# Patient Record
Sex: Female | Born: 1985 | Race: Black or African American | Hispanic: No | Marital: Single | Smoking: Never smoker
Health system: Southern US, Community
[De-identification: ages and names within clinical notes are randomized; demographics above are authoritative.]

## PROBLEM LIST (undated history)

## (undated) ENCOUNTER — Inpatient Hospital Stay (HOSPITAL_COMMUNITY): Payer: Self-pay

## (undated) DIAGNOSIS — O039 Complete or unspecified spontaneous abortion without complication: Secondary | ICD-10-CM

## (undated) DIAGNOSIS — Z789 Other specified health status: Secondary | ICD-10-CM

## (undated) HISTORY — PX: NO PAST SURGERIES: SHX2092

---
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
## 2008-01-16 ENCOUNTER — Emergency Department (HOSPITAL_COMMUNITY): Admission: EM | Admit: 2008-01-16 | Discharge: 2008-01-16 | Payer: Self-pay | Admitting: Emergency Medicine

## 2008-01-19 ENCOUNTER — Emergency Department (HOSPITAL_COMMUNITY): Admission: EM | Admit: 2008-01-19 | Discharge: 2008-01-19 | Payer: Self-pay | Admitting: Emergency Medicine

## 2008-01-20 ENCOUNTER — Emergency Department (HOSPITAL_COMMUNITY): Admission: EM | Admit: 2008-01-20 | Discharge: 2008-01-20 | Payer: Self-pay | Admitting: Emergency Medicine

## 2008-10-30 ENCOUNTER — Emergency Department (HOSPITAL_COMMUNITY): Admission: EM | Admit: 2008-10-30 | Discharge: 2008-10-30 | Payer: Self-pay | Admitting: Emergency Medicine

## 2011-09-07 LAB — URINALYSIS, ROUTINE W REFLEX MICROSCOPIC
Glucose, UA: NEGATIVE
Ketones, ur: NEGATIVE
Nitrite: NEGATIVE
pH: 6

## 2011-09-07 LAB — WET PREP, GENITAL
Trich, Wet Prep: NONE SEEN
Yeast Wet Prep HPF POC: NONE SEEN

## 2011-09-07 LAB — GC/CHLAMYDIA PROBE AMP, GENITAL: GC Probe Amp, Genital: NEGATIVE

## 2011-09-07 LAB — PREGNANCY, URINE: Preg Test, Ur: NEGATIVE

## 2015-09-11 ENCOUNTER — Encounter (HOSPITAL_COMMUNITY): Payer: Self-pay | Admitting: *Deleted

## 2015-09-11 ENCOUNTER — Emergency Department (HOSPITAL_COMMUNITY)
Admission: EM | Admit: 2015-09-11 | Discharge: 2015-09-11 | Disposition: A | Discharge status: Home / Self Care | Attending: Emergency Medicine | Admitting: Emergency Medicine

## 2015-09-11 DIAGNOSIS — Z3202 Encounter for pregnancy test, result negative: Secondary | ICD-10-CM | POA: Insufficient documentation

## 2015-09-11 DIAGNOSIS — R3915 Urgency of urination: Secondary | ICD-10-CM | POA: Diagnosis present

## 2015-09-11 DIAGNOSIS — B9689 Other specified bacterial agents as the cause of diseases classified elsewhere: Secondary | ICD-10-CM

## 2015-09-11 DIAGNOSIS — N760 Acute vaginitis: Secondary | ICD-10-CM | POA: Insufficient documentation

## 2015-09-11 LAB — POC URINE PREG, ED: PREG TEST UR: NEGATIVE

## 2015-09-11 LAB — URINALYSIS, ROUTINE W REFLEX MICROSCOPIC
Bilirubin Urine: NEGATIVE
GLUCOSE, UA: NEGATIVE mg/dL
Hgb urine dipstick: NEGATIVE
Ketones, ur: NEGATIVE mg/dL
LEUKOCYTES UA: NEGATIVE
Nitrite: NEGATIVE
PROTEIN: NEGATIVE mg/dL
SPECIFIC GRAVITY, URINE: 1.02 (ref 1.005–1.030)
UROBILINOGEN UA: 0.2 mg/dL (ref 0.0–1.0)
pH: 8 (ref 5.0–8.0)

## 2015-09-11 LAB — WET PREP, GENITAL
TRICH WET PREP: NONE SEEN
Yeast Wet Prep HPF POC: NONE SEEN

## 2015-09-11 MED ORDER — METRONIDAZOLE 500 MG PO TABS: 500.0000 mg | ORAL_TABLET | Freq: Two times a day (BID) | ORAL | Status: DC

## 2015-09-11 NOTE — Discharge Instructions (Signed)

## 2015-09-11 NOTE — ED Provider Notes (Signed)
CSN: [id number]     Arrival date & time 09/11/15  1134 History   First MD Initiated Contact with Patient 09/11/15 1150     Chief Complaint  Patient presents with  . Urinary Urgency     (Consider location/radiation/quality/duration/timing/severity/associated sxs/prior Treatment) HPI   Caroline Weber is a 29 y.o. female who presents to the Emergency Department complaining of vaginal discharge for 2 days, urinary urgency for one day.  She describes the discharge as thin, white and non-odorous.  She also describes an urgency to urinate, but states that she voids a normal amount.  She denies new sexual partners, but admits to unprotected sex.  She also denies fever, burning or pain with urination, abdominal pain, n/v, hematuria, back pain, and pelvic pain.   History reviewed. No pertinent past medical history. History reviewed. No pertinent past surgical history. History reviewed. No pertinent family history. Social History  Substance Use Topics  . Smoking status: Never Smoker   . Smokeless tobacco: None  . Alcohol Use: Yes     Comment: occ   OB History    No data available     Review of Systems  Constitutional: Negative for fever, chills, activity change and appetite change.  Respiratory: Negative for chest tightness and shortness of breath.   Gastrointestinal: Negative for nausea, vomiting and abdominal pain.  Genitourinary: Positive for urgency and vaginal discharge. Negative for dysuria, frequency, hematuria, flank pain, decreased urine volume, vaginal bleeding, difficulty urinating and pelvic pain.  Musculoskeletal: Negative for back pain.  Skin: Negative for rash.  Neurological: Negative for dizziness, weakness and numbness.  Hematological: Negative for adenopathy.  Psychiatric/Behavioral: Negative for confusion.  All other systems reviewed and are negative.     Allergies  Review of patient's allergies indicates no known allergies.  Home Medications   Prior to  Admission medications   Medication Sig Start Date End Date Taking? Authorizing Provider  metroNIDAZOLE (FLAGYL) 500 MG tablet Take 1 tablet (500 mg total) by mouth 2 (two) times daily. 09/11/15   Demika Langenderfer, PA-C   BP 116/75 mmHg  Pulse 80  Temp(Src) 98.2 F (36.8 C) (Oral)  Resp 16  Ht  (1.6 m)  Wt 170 lb (77.111 kg)  BMI 30.12 kg/m2  SpO2 100% Physical Exam  Constitutional: She is oriented to person, place, and time. She appears well-developed and well-nourished. No distress.  HENT:  Head: Normocephalic and atraumatic.  Cardiovascular: Normal rate, regular rhythm, normal heart sounds and intact distal pulses.   No murmur heard. Pulmonary/Chest: Effort normal and breath sounds normal. No respiratory distress. She has no wheezes. She has no rales.  Abdominal: Soft. Normal appearance. She exhibits no distension and no mass. There is no hepatosplenomegaly. There is no tenderness. There is no rigidity, no rebound, no guarding, no CVA tenderness and no tenderness at McBurney's point.  Genitourinary: Uterus normal. There is no tenderness or lesion on the right labia. There is no tenderness or lesion on the left labia. Cervix exhibits no motion tenderness, no discharge and no friability. Right adnexum displays no mass and no tenderness. Left adnexum displays no mass and no tenderness. No bleeding in the vagina. No foreign body around the vagina. Vaginal discharge found.  Musculoskeletal: Normal range of motion. She exhibits no edema.  Neurological: She is alert and oriented to person, place, and time. Coordination normal.  Skin: Skin is warm and dry. No rash noted.  Psychiatric: She has a normal mood and affect.  Nursing note and vitals reviewed.  ED Course  Procedures (including critical care time) Labs Review Labs Reviewed  WET PREP, GENITAL - Abnormal; Notable for the following:    Clue Cells Wet Prep HPF POC RARE (*)    WBC, Wet Prep HPF POC FEW (*)    All other components  within normal limits  URINE CULTURE  URINALYSIS, ROUTINE W REFLEX MICROSCOPIC (NOT AT [facility])  POC URINE PREG, ED  GC/CHLAMYDIA PROBE AMP ([place]) NOT AT [facility]    Imaging Review No results found. I have personally reviewed and evaluated these images and lab results as part of my medical decision-making.   EKG Interpretation None      MDM   Final diagnoses:  Bacterial vaginosis   Pt is well appearing, abd is soft, NT.  No pelvic tenderness on exam.  Vitals stable.  Pt advised of risk of STD's, offered treatment here, she prefers to be contacted to return if GC, chlamydia cultures are positive.  Will treat for BV since patient is symptomatic.  She appears stable for d/c and agrees to return for any worsening sx's.     Pauline Aus, PA-C 09/13/15 1610  Lavera Guise, MD 09/15/15 [phone]

## 2015-09-11 NOTE — ED Notes (Signed)
Patient reports urinary urgency, denies dysuria that started this morning. Also reports vaginal discharge x 2 days.

## 2015-09-12 LAB — GC/CHLAMYDIA PROBE AMP (~~LOC~~) NOT AT ARMC
Chlamydia: NEGATIVE
Neisseria Gonorrhea: NEGATIVE

## 2015-09-13 LAB — URINE CULTURE

## 2016-03-03 ENCOUNTER — Ambulatory Visit (INDEPENDENT_AMBULATORY_CARE_PROVIDER_SITE_OTHER): Admitting: Family Medicine

## 2016-03-03 VITALS — BP 112/70 | HR 76 | Temp 98.0°F | Resp 16 | Ht 64.0 in | Wt 188.4 lb

## 2016-03-03 DIAGNOSIS — Z124 Encounter for screening for malignant neoplasm of cervix: Secondary | ICD-10-CM | POA: Diagnosis not present

## 2016-03-03 DIAGNOSIS — N898 Other specified noninflammatory disorders of vagina: Secondary | ICD-10-CM

## 2016-03-03 DIAGNOSIS — Z113 Encounter for screening for infections with a predominantly sexual mode of transmission: Secondary | ICD-10-CM

## 2016-03-03 DIAGNOSIS — J029 Acute pharyngitis, unspecified: Secondary | ICD-10-CM | POA: Diagnosis not present

## 2016-03-03 LAB — POCT URINALYSIS DIP (MANUAL ENTRY)
BILIRUBIN UA: NEGATIVE
Bilirubin, UA: NEGATIVE
Glucose, UA: NEGATIVE
Leukocytes, UA: NEGATIVE
Nitrite, UA: NEGATIVE
PROTEIN UA: NEGATIVE
RBC UA: NEGATIVE
Spec Grav, UA: 1.015
UROBILINOGEN UA: 0.2
pH, UA: 5.5

## 2016-03-03 LAB — POCT CBC
Granulocyte percent: 68.5 %G (ref 37–80)
HCT, POC: 37.4 % — AB (ref 37.7–47.9)
HEMOGLOBIN: 13.8 g/dL (ref 12.2–16.2)
LYMPH, POC: 2.3 (ref 0.6–3.4)
MCH: 32 pg — AB (ref 27–31.2)
MCHC: 36.9 g/dL — AB (ref 31.8–35.4)
MCV: 86.6 fL (ref 80–97)
MID (cbc): 0.4 (ref 0–0.9)
MPV: 8.5 fL (ref 0–99.8)
PLATELET COUNT, POC: 261 10*3/uL (ref 142–424)
POC Granulocyte: 6 (ref 2–6.9)
POC LYMPH PERCENT: 27 %L (ref 10–50)
POC MID %: 4.5 % (ref 0–12)
RBC: 4.32 M/uL (ref 4.04–5.48)
RDW, POC: 12.9 %
WBC: 8.7 10*3/uL (ref 4.6–10.2)

## 2016-03-03 LAB — POCT URINE PREGNANCY: PREG TEST UR: NEGATIVE

## 2016-03-03 LAB — POCT WET + KOH PREP
TRICH BY WET PREP: ABSENT
YEAST BY KOH: ABSENT
YEAST BY WET PREP: ABSENT

## 2016-03-03 LAB — HEPATITIS C ANTIBODY: HCV AB: NEGATIVE

## 2016-03-03 LAB — HIV ANTIBODY (ROUTINE TESTING W REFLEX): HIV 1&2 Ab, 4th Generation: NONREACTIVE

## 2016-03-03 LAB — POCT RAPID STREP A (OFFICE): RAPID STREP A SCREEN: NEGATIVE

## 2016-03-03 NOTE — Patient Instructions (Addendum)
   IF you received an x-ray today, you will receive an invoice from [facility]. Please contact  Radiology at [phone] with questions or concerns regarding your invoice.   IF you received labwork today, you will receive an invoice from [facility]. Please contact [facility] at [phone] with questions or concerns regarding your invoice.   Our billing staff will not be able to assist you with questions regarding bills from these companies.  You will be contacted with the lab results as soon as they are available. The fastest way to get your results is to activate your My Chart account. Instructions are located on the last page of this paperwork. If you have not heard from us regarding the results in 2 weeks, please contact this office.    Pharyngitis Pharyngitis is redness, pain, and swelling (inflammation) of your pharynx.  CAUSES  Pharyngitis is usually caused by infection. Most of the time, these infections are from viruses (viral) and are part of a cold. However, sometimes pharyngitis is caused by bacteria (bacterial). Pharyngitis can also be caused by allergies. Viral pharyngitis may be spread from person to person by coughing, sneezing, and personal items or utensils (cups, forks, spoons, toothbrushes). Bacterial pharyngitis may be spread from person to person by more intimate contact, such as kissing.  SIGNS AND SYMPTOMS  Symptoms of pharyngitis include:   Sore throat.   Tiredness (fatigue).   Low-grade fever.   Headache.  Joint pain and muscle aches.  Skin rashes.  Swollen lymph nodes.  Plaque-like film on throat or tonsils (often seen with bacterial pharyngitis). DIAGNOSIS  Your health care provider will ask you questions about your illness and your symptoms. Your medical history, along with a physical exam, is often all that is needed to diagnose pharyngitis. Sometimes, a rapid strep test is done. Other lab tests may  also be done, depending on the suspected cause.  TREATMENT  Viral pharyngitis will usually get better in 3-4 days without the use of medicine. Bacterial pharyngitis is treated with medicines that kill germs (antibiotics).  HOME CARE INSTRUCTIONS   Drink enough water and fluids to keep your urine clear or pale yellow.   Only take over-the-counter or prescription medicines as directed by your health care provider:   If you are prescribed antibiotics, make sure you finish them even if you start to feel better.   Do not take aspirin.   Get lots of rest.   Gargle with 8 oz of salt water ( tsp of salt per 1 qt of water) as often as every 1-2 hours to soothe your throat.   Throat lozenges (if you are not at risk for choking) or sprays may be used to soothe your throat. SEEK MEDICAL CARE IF:   You have large, tender lumps in your neck.  You have a rash.  You cough up green, yellow-brown, or bloody spit. SEEK IMMEDIATE MEDICAL CARE IF:   Your neck becomes stiff.  You drool or are unable to swallow liquids.  You vomit or are unable to keep medicines or liquids down.  You have severe pain that does not go away with the use of recommended medicines.  You have trouble breathing (not caused by a stuffy nose). MAKE SURE YOU:   Understand these instructions.  Will watch your condition.  Will get help right away if you are not doing well or get worse.   This information is not intended to replace advice given to you by your health care   provider. Make sure you discuss any questions you have with your health care provider.   Document Released: 11/22/2005 Document Revised: 09/12/2013 Document Reviewed: 07/30/2013 Elsevier Interactive Patient Education 2016 [employer].  Sexually Transmitted Disease A sexually transmitted disease (STD) is a disease or infection that may be passed (transmitted) from person to person, usually during sexual activity. This may happen by way of  saliva, semen, blood, vaginal mucus, or urine. Common STDs include:  Gonorrhea.  Chlamydia.  Syphilis.  HIV and AIDS.  Genital herpes.  Hepatitis B and C.  Trichomonas.  Human papillomavirus (HPV).  Pubic lice.  Scabies.  Mites.  Bacterial vaginosis. WHAT ARE CAUSES OF STDs? An STD may be caused by bacteria, a virus, or parasites. STDs are often transmitted during sexual activity if one person is infected. However, they may also be transmitted through nonsexual means. STDs may be transmitted after:   Sexual intercourse with an infected person.  Sharing sex toys with an infected person.  Sharing needles with an infected person or using unclean piercing or tattoo needles.  Having intimate contact with the genitals, mouth, or rectal areas of an infected person.  Exposure to infected fluids during birth. WHAT ARE THE SIGNS AND SYMPTOMS OF STDs? Different STDs have different symptoms. Some people may not have any symptoms. If symptoms are present, they may include:  Painful or bloody urination.  Pain in the pelvis, abdomen, vagina, anus, throat, or eyes.  A skin rash, itching, or irritation.  Growths, ulcerations, blisters, or sores in the genital and anal areas.  Abnormal vaginal discharge with or without bad odor.  Penile discharge in men.  Fever.  Pain or bleeding during sexual intercourse.  Swollen glands in the groin area.  Yellow skin and eyes (jaundice). This is seen with hepatitis.  Swollen testicles.  Infertility.  Sores and blisters in the mouth. HOW ARE STDs DIAGNOSED? To make a diagnosis, your health care provider may:  Take a medical history.  Perform a physical exam.  Take a sample of any discharge to examine.  Swab the throat, cervix, opening to the penis, rectum, or vagina for testing.  Test a sample of your first morning urine.  Perform blood tests.  Perform a Pap test, if this applies.  Perform a colposcopy.  Perform a  laparoscopy. HOW ARE STDs TREATED? Treatment depends on the STD. Some STDs may be treated but not cured.  Chlamydia, gonorrhea, trichomonas, and syphilis can be cured with antibiotic medicine.  Genital herpes, hepatitis, and HIV can be treated, but not cured, with prescribed medicines. The medicines lessen symptoms.  Genital warts from HPV can be treated with medicine or by freezing, burning (electrocautery), or surgery. Warts may come back.  HPV cannot be cured with medicine or surgery. However, abnormal areas may be removed from the cervix, vagina, or vulva.  If your diagnosis is confirmed, your recent sexual partners need treatment. This is true even if they are symptom-free or have a negative culture or evaluation. They should not have sex until their health care providers say it is okay.  Your health care provider may test you for infection again 3 months after treatment. HOW CAN I REDUCE MY RISK OF GETTING AN STD? Take these steps to reduce your risk of getting an STD:  Use latex condoms, dental dams, and water-soluble lubricants during sexual activity. Do not use petroleum jelly or oils.  Avoid having multiple sex partners.  Do not have sex with someone who has other sex partners  Do not have sex with anyone you do not know or who is at high risk for an STD.  Avoid risky sex practices that can break your skin.  Do not have sex if you have open sores on your mouth or skin.  Avoid drinking too much alcohol or taking illegal drugs. Alcohol and drugs can affect your judgment and put you in a vulnerable position.  Avoid engaging in oral and anal sex acts.  Get vaccinated for HPV and hepatitis. If you have not received these vaccines in the past, talk to your health care provider about whether one or both might be right for you.  If you are at risk of being infected with HIV, it is recommended that you take a prescription medicine daily to prevent HIV infection. This is called  pre-exposure prophylaxis (PrEP). You are considered at risk if:  You are a man who has sex with other men (MSM).  You are a heterosexual man or woman and are sexually active with more than one partner.  You take drugs by injection.  You are sexually active with a partner who has HIV.  Talk with your health care provider about whether you are at high risk of being infected with HIV. If you choose to begin PrEP, you should first be tested for HIV. You should then be tested every 3 months for as long as you are taking PrEP. WHAT SHOULD I DO IF I THINK I HAVE AN STD?  See your health care provider.  Tell your sexual partner(s). They should be tested and treated for any STDs.  Do not have sex until your health care provider says it is okay. WHEN SHOULD I GET IMMEDIATE MEDICAL CARE? Contact your health care provider right away if:   You have severe abdominal pain.  You are a man and notice swelling or pain in your testicles.  You are a woman and notice swelling or pain in your vagina.   This information is not intended to replace advice given to you by your health care provider. Make sure you discuss any questions you have with your health care provider.   Document Released: 02/12/2003 Document Revised: 12/13/2014 Document Reviewed: 06/12/2013 Elsevier Interactive Patient Education [employer]2016 Elsevier Inc.

## 2016-03-03 NOTE — Progress Notes (Signed)
Subjective:  This chart was scribed for Caroline Weber, by Caroline Weber,scribe, at [facility].  This patient was seen in room 3 and the patient's care was started at 8:37 AM.   Chief Complaint  Patient presents with  . Sore Throat  . Srd check     Patient ID: Caroline Weber, female    DOB: [birth year], 30 y.o.   MRN: [id number]  HPI HPI Comments: Caroline Weber is a [age] y.o. female who presents to the [facility] complaining of a sore throat onset 3-4 days ago with associated symptoms of coughing.  She states that she has a "red line" on her tonsils which she has had in the past when she had strep throat.   Patient has used Penicillin (twice yesterday and 1 today) from her past sinus infection in order to treat herself but denies any significant change to her symptoms.   She denies any fevers/chills, body aches, sinus congestion, rhinorrhea.  She did not have any exposures to strep throat which she is aware of at work but states that her co workers have been coughing frequently.  Patient has not had a flu shot this year.   Patient would also like a routine STD test today.  She does not have any known exposures.  She denies any vaginal discharge but states that she regularly gets a yeast infection 1 week after her cycle. Her last pap smear was over 1 year ago and she has requested to have one today. She denies any history of abnormal pap smears. She does not use birth control and is married.  Patient denies any urinary symptoms. She has completed the Gardasil series.    There are no active problems to display for this patient.  History reviewed. No pertinent past medical history. History reviewed. No pertinent past surgical history. No Known Allergies Prior to Admission medications   Medication Sig Start Date End Date Taking? Authorizing Provider  metroNIDAZOLE (FLAGYL) 500 MG tablet Take 1 tablet (500 mg total) by mouth 2 (two) times daily. Patient  not taking: Reported on 03/03/2016 09/11/15   Pauline Ausammy Triplett, PA-C   Social History   Social History  . Marital Status: Married    Spouse Name: N/A  . Number of Children: N/A  . Years of Education: N/A   Occupational History  . Not on file.   Social History Main Topics  . Smoking status: Never Smoker   . Smokeless tobacco: Not on file  . Alcohol Use: Yes     Comment: occ  . Drug Use: No  . Sexual Activity: Yes    Birth Control/ Protection: None   Other Topics Concern  . Not on file   Social History Narrative     Review of Systems  Constitutional: Negative for fever and chills.  HENT: Positive for sore throat.   Eyes: Negative for pain, redness and itching.  Respiratory: Positive for cough.   Gastrointestinal: Negative for nausea and vomiting.  Genitourinary: Negative for dysuria, urgency, frequency, hematuria, decreased urine volume, vaginal bleeding, vaginal discharge, difficulty urinating, vaginal pain, menstrual problem and pelvic pain.  Musculoskeletal: Negative for neck pain and neck stiffness.       Objective:   Physical Exam  Constitutional: She is oriented to person, place, and time. She appears well-developed and well-nourished. No distress.  HENT:  Head: Normocephalic and atraumatic.  Right Ear: External ear normal.  Left Ear: External ear normal.  Nose: Nose normal.  Mouth/Throat: Oropharynx is clear and moist. No oropharyngeal exudate.  Slight erythema in oropharynx.   Eyes: Pupils are equal, round, and reactive to light.  Neck: Normal range of motion.  No submandibular or tonsillar adenopathy.   Cardiovascular: Normal rate, regular rhythm, S1 normal, S2 normal and normal heart sounds.  Exam reveals no gallop and no friction rub.   No murmur heard. Pulmonary/Chest: Effort normal and breath sounds normal. No respiratory distress. She has no wheezes. She has no rales.  Genitourinary:  Pelvic exam normal, mild amount of thick white discharge but normal  labia, vagina and uterus, normal adnexa.   Lymphadenopathy:    She has no cervical adenopathy.  Neurological: She is alert and oriented to person, place, and time.  Skin: Skin is warm and dry.  Psychiatric: She has a normal mood and affect. Her behavior is normal.    Filed Vitals:   03/03/16 0829  BP: 112/70  Pulse: 76  Temp: 98 F (36.7 C)  TempSrc: Oral  Resp: 16  Height:  (1.626 m)  Weight: 188 lb 6.4 oz (85.458 kg)  SpO2: 98%   Results for orders placed or performed in visit on 03/03/16  POCT Wet + KOH Prep  Result Value Ref Range   Yeast by KOH Absent Present, Absent   Yeast by wet prep Absent Present, Absent   WBC by wet prep None None, Few, Too numerous to count   Clue Cells Wet Prep HPF POC Few (A) None, Too numerous to count   Trich by wet prep Absent Present, Absent   Bacteria Wet Prep HPF POC Moderate (A) None, Few, Too numerous to count   Epithelial Cells By [employer] ([facility]) Moderate (A) None, Few, Too numerous to count   RBC,UR,HPF,POC None None RBC/hpf  POCT rapid strep A  Result Value Ref Range   Rapid Strep A Screen Negative Negative  POCT CBC  Result Value Ref Range   WBC 8.7 4.6 - 10.2 K/uL   Lymph, poc 2.3 0.6 - 3.4   POC LYMPH PERCENT 27.0 10 - 50 %L   MID (cbc) 0.4 0 - 0.9   POC MID % 4.5 0 - 12 %M   POC Granulocyte 6.0 2 - 6.9   Granulocyte percent 68.5 37 - 80 %G   RBC 4.32 4.04 - 5.48 M/uL   Hemoglobin 13.8 12.2 - 16.2 g/dL   HCT, POC 96.0 (A) 45.4 - 47.9 %   MCV 86.6 80 - 97 fL   MCH, POC 32.0 (A) 27 - 31.2 pg   MCHC 36.9 (A) 31.8 - 35.4 g/dL   RDW, POC 09.8 %   Platelet Count, POC 261 142 - 424 K/uL   MPV 8.5 0 - 99.8 fL  POCT urine pregnancy  Result Value Ref Range   Preg Test, Ur Negative Negative  POCT urinalysis dipstick  Result Value Ref Range   Color, UA yellow yellow   Clarity, UA clear clear   Glucose, UA negative negative   Bilirubin, UA negative negative   Ketones, POC UA negative negative   Spec Grav, UA 1.015     Blood, UA negative negative   pH, UA 5.5    Protein Ur, POC negative negative   Urobilinogen, UA 0.2    Nitrite, UA Negative Negative   Leukocytes, UA Negative Negative        Assessment & Plan:   Patient requested Fluconazole despite negative wet prep today.  Adviseded that if she has symptoms, she can  use over the counter Monistat or have repeat testing done in clinic.  Since we do not have confirmation that her symptoms are a yeast infection, I will put in a order for her to have a wet prep for when her symptoms occur in the future.   1. Acute pharyngitis, unspecified etiology   2. Screening for STD (sexually transmitted disease)   3. Screening for cervical cancer   4. Vaginal discharge     Orders Placed This Encounter  Procedures  . Culture, Group A Strep    Order Specific Question:  Source    Answer:  throat  . HIV antibody  . RPR  . Hepatitis C Antibody  . POCT Wet + KOH Prep  . POCT rapid strep A  . POCT CBC  . POCT urine pregnancy  . POCT urinalysis dipstick  . POCT Wet + KOH Prep    Standing Status: Future     Number of Occurrences:      Standing Expiration Date: 03/10/2016     I personally performed the services described in this documentation, which was scribed in my presence. The recorded information has been reviewed and considered, and addended by me as needed.  Caroline Sorenson, MD MPH

## 2016-03-04 LAB — CULTURE, GROUP A STREP: Organism ID, Bacteria: NORMAL

## 2016-03-04 LAB — RPR

## 2016-03-05 LAB — PAP IG, CT-NG, RFX HPV ASCU
CHLAMYDIA PROBE AMP: NOT DETECTED
GC PROBE AMP: NOT DETECTED

## 2016-03-12 ENCOUNTER — Encounter: Payer: Self-pay | Admitting: Family Medicine

## 2016-10-22 ENCOUNTER — Encounter (HOSPITAL_BASED_OUTPATIENT_CLINIC_OR_DEPARTMENT_OTHER): Payer: Self-pay | Admitting: Emergency Medicine

## 2016-10-22 ENCOUNTER — Emergency Department (HOSPITAL_BASED_OUTPATIENT_CLINIC_OR_DEPARTMENT_OTHER)

## 2016-10-22 ENCOUNTER — Emergency Department (HOSPITAL_BASED_OUTPATIENT_CLINIC_OR_DEPARTMENT_OTHER)
Admission: EM | Admit: 2016-10-22 | Discharge: 2016-10-23 | Disposition: A | Discharge status: Home / Self Care | Attending: Emergency Medicine | Admitting: Emergency Medicine

## 2016-10-22 DIAGNOSIS — Y929 Unspecified place or not applicable: Secondary | ICD-10-CM | POA: Diagnosis not present

## 2016-10-22 DIAGNOSIS — S62501A Fracture of unspecified phalanx of right thumb, initial encounter for closed fracture: Secondary | ICD-10-CM | POA: Insufficient documentation

## 2016-10-22 DIAGNOSIS — Y999 Unspecified external cause status: Secondary | ICD-10-CM | POA: Insufficient documentation

## 2016-10-22 DIAGNOSIS — S6991XA Unspecified injury of right wrist, hand and finger(s), initial encounter: Secondary | ICD-10-CM | POA: Diagnosis present

## 2016-10-22 DIAGNOSIS — W230XXA Caught, crushed, jammed, or pinched between moving objects, initial encounter: Secondary | ICD-10-CM | POA: Insufficient documentation

## 2016-10-22 DIAGNOSIS — Y939 Activity, unspecified: Secondary | ICD-10-CM | POA: Insufficient documentation

## 2016-10-22 NOTE — ED Provider Notes (Signed)
[facility]   CSN: [id number] Arrival date & time: 10/22/16  2331  By signing my name below, I, Soijett Blue, attest that this documentation has been prepared under the direction and in the presence of Dierdre ForthHannah Dianey Suchy, PA-C Electronically Signed: Soijett Blue, ED Scribe. 10/23/16. 12:04 AM.   History   Chief Complaint Chief Complaint  Patient presents with  . Hand Injury    HPI Caroline Weber is a 30 y.o. female who presents to the Emergency Department complaining of right hand injury occurring 10:58 PM tonight PTA. Pt notes that she accidentally slammed her right thumb in her car door. Pt is having associated symptoms of right thumb pain and right thumb swelling. She notes that she has not tried any medications for the relief of her symptoms. She denies wound, numbness, color change, and any other symptoms.    The history is provided by the patient. No language interpreter was used.    History reviewed. No pertinent past medical history.  There are no active problems to display for this patient.   History reviewed. No pertinent surgical history.  OB History    No data available       Home Medications    Prior to Admission medications   Not on File    Family History No family history on file.  Social History Social History  Substance Use Topics  . Smoking status: Never Smoker  . Smokeless tobacco: Never Used  . Alcohol use Yes     Comment: occ     Allergies   Patient has no known allergies.   Review of Systems Review of Systems  Musculoskeletal: Positive for arthralgias (right thumb) and joint swelling (right thumb).  Skin: Negative for color change and wound.  Neurological: Negative for numbness.  All other systems reviewed and are negative.    Physical Exam Updated Vital Signs BP 123/92 (BP Location: Left Arm)   Pulse 71   Temp 98 F (36.7 C) (Oral)   Resp 18   Ht 5\' 3"  (1.6 m)   Wt 174 lb (78.9 kg)    SpO2 98%   BMI 30.82 kg/m   Physical Exam  Constitutional: She appears well-developed and well-nourished. No distress.  HENT:  Head: Normocephalic and atraumatic.  Eyes: Conjunctivae are normal.  Neck: Normal range of motion.  Cardiovascular: Normal rate, regular rhythm and intact distal pulses.   Capillary refill < 3 sec  Pulmonary/Chest: Effort normal and breath sounds normal.  Musculoskeletal: She exhibits tenderness. She exhibits no edema.  Right thumb with swelling and ecchymosis. decreaed ROM of the DIP with TTP throughout.   Neurological: She is alert. No sensory deficit. Coordination normal.  Strength is 4/5 with flexion and extension Sensation intact.   Skin: Skin is warm and dry. She is not diaphoretic.  No tenting of the skin  Psychiatric: She has a normal mood and affect.  Nursing note and vitals reviewed.    ED Treatments / Results  DIAGNOSTIC STUDIES: Oxygen Saturation is 98% on RA, nl by my interpretation.    COORDINATION OF CARE: 11:58 PM Discussed treatment plan with pt at bedside which includes right finger thumb xray, thumb spica splint, referral and follow up with hand surgeon, and pt agreed to plan.   Radiology Dg Finger Thumb Right  Result Date: 10/22/2016 CLINICAL DATA:  Shut dominant car door. EXAM: RIGHT THUMB 2+V COMPARISON:  None. FINDINGS: Small fragment at the palmar base of the first distal phalanx may represent an  avulsion fragment. No other areas of suspicion for acute fracture. No dislocation. No radiopaque foreign body. IMPRESSION: Possible small avulsion at the palmar aspect of the interphalangeal joint. Electronically Signed   By: Ellery Plunkaniel R Mitchell M.D.   On: 10/22/2016 23:52    Procedures Procedures (including critical care time)  Medications Ordered in ED Medications  ibuprofen (ADVIL,MOTRIN) tablet 800 mg (800 mg Oral Given 10/23/16 0013)     Initial Impression / Assessment and Plan / ED Course  I have reviewed the triage vital  signs and the nursing notes.  Pertinent imaging results that were available during my care of the patient were reviewed by me and considered in my medical decision making (see chart for details).  Clinical Course     Pt With small avulsion of the palmar aspect of the interphalangeal joint of the right thumb. Patient placed in thumb spica. Conservative therapies discussed and patient will be followed by hand surgery.  She is neurovascularly intact. No open wounds.  Final Clinical Impressions(s) / ED Diagnoses   Final diagnoses:  Avulsion fracture of thumb, right, closed, initial encounter    New Prescriptions New Prescriptions   No medications on file   I personally performed the services described in this documentation, which was scribed in my presence. The recorded information has been reviewed and is accurate.    Dierdre ForthHannah Allene Furuya, PA-C 10/23/16 0038    Paula LibraJohn Molpus, MD 10/23/16 [phone]

## 2016-10-22 NOTE — ED Triage Notes (Signed)
Pt c/o right thumb pain after closing it in car door

## 2016-10-23 MED ORDER — IBUPROFEN 800 MG PO TABS
800.0000 mg | ORAL_TABLET | Freq: Once | ORAL | Status: AC
  Administered 2016-10-23: 800 mg via ORAL
  Filled 2016-10-23: qty 1

## 2016-10-23 NOTE — Discharge Instructions (Signed)

## 2016-11-21 ENCOUNTER — Emergency Department (HOSPITAL_COMMUNITY)
Admission: EM | Admit: 2016-11-21 | Discharge: 2016-11-21 | Disposition: A | Discharge status: Home / Self Care | Payer: BLUE CROSS/BLUE SHIELD | Attending: Emergency Medicine | Admitting: Emergency Medicine

## 2016-11-21 ENCOUNTER — Encounter (HOSPITAL_COMMUNITY): Payer: Self-pay | Admitting: Emergency Medicine

## 2016-11-21 DIAGNOSIS — Z3200 Encounter for pregnancy test, result unknown: Secondary | ICD-10-CM | POA: Diagnosis present

## 2016-11-21 DIAGNOSIS — Z3201 Encounter for pregnancy test, result positive: Secondary | ICD-10-CM | POA: Diagnosis not present

## 2016-11-21 DIAGNOSIS — Z3A01 Less than 8 weeks gestation of pregnancy: Secondary | ICD-10-CM

## 2016-11-21 LAB — URINALYSIS, ROUTINE W REFLEX MICROSCOPIC
BACTERIA UA: NONE SEEN
BILIRUBIN URINE: NEGATIVE
GLUCOSE, UA: NEGATIVE mg/dL
HGB URINE DIPSTICK: NEGATIVE
Ketones, ur: NEGATIVE mg/dL
LEUKOCYTES UA: NEGATIVE
NITRITE: NEGATIVE
PROTEIN: 30 mg/dL — AB
Specific Gravity, Urine: 1.023 (ref 1.005–1.030)
pH: 6 (ref 5.0–8.0)

## 2016-11-21 LAB — POC URINE PREG, ED: Preg Test, Ur: POSITIVE — AB

## 2016-11-21 NOTE — ED Triage Notes (Signed)
Pt here requesting pregnancy test; pt had home test today that was positive and LMP was 11/17; pt first pregnancy

## 2016-11-21 NOTE — ED Notes (Signed)
See providers assessment.  

## 2016-11-21 NOTE — ED Provider Notes (Signed)
[facility] Provider Note   CSN: [id number] Arrival date & time: 11/21/16  1816 By signing my name below, I, Bridgette HabermannMaria Tan, attest that this documentation has been prepared under the direction and in the presence of Alvira MondayErin Tekoa Hamor, MD. Electronically Signed: Bridgette HabermannMaria Tan, ED Scribe. 11/21/16. 7:42 PM.  History   Chief Complaint Chief Complaint  Patient presents with  . Possible Pregnancy   HPI Comments: Caroline Weber is a 30 y.o. female (G1P0A0) with no pertinent PMHx, who presents to the Emergency Department requesting a pregnancy test. Pt states that she took a home test today that was positive. Pt states she has had mild cramping abdominal pain 2 days ago that has since resolved. She also noticed mild vaginal discharge today. Her LNMP was 10/22/16. Pt denies nausea, vomiting, diarrhea, dysuria, vaginal bleeding, hematuria, or any other associated symptoms.   The history is provided by the patient. No language interpreter was used.    History reviewed. No pertinent past medical history.  There are no active problems to display for this patient.   History reviewed. No pertinent surgical history.  OB History    No data available       Home Medications    Prior to Admission medications   Not on File    Family History History reviewed. No pertinent family history.  Social History Social History  Substance Use Topics  . Smoking status: Never Smoker  . Smokeless tobacco: Never Used  . Alcohol use Yes     Comment: occ     Allergies   Patient has no known allergies.   Review of Systems Review of Systems  Constitutional: Negative for chills and fever.  Gastrointestinal: Negative for abdominal pain, diarrhea, nausea and vomiting.  Genitourinary: Positive for vaginal discharge. Negative for difficulty urinating, dysuria and vaginal bleeding.  All other systems reviewed and are negative.  Physical Exam Updated Vital Signs BP 132/71 (BP Location: Left Arm)    Pulse 87   Temp 98.7 F (37.1 C) (Oral)   Resp 18   SpO2 100%   Physical Exam  Constitutional: She appears well-developed and well-nourished.  HENT:  Head: Normocephalic.  Eyes: Conjunctivae are normal.  Cardiovascular: Normal rate, regular rhythm and normal heart sounds.   Pulmonary/Chest: Effort normal. No respiratory distress.  Abdominal: She exhibits no distension and no mass. There is no tenderness. There is no guarding.  Musculoskeletal: Normal range of motion.  Neurological: She is alert.  Skin: Skin is warm and dry.  Psychiatric: She has a normal mood and affect. Her behavior is normal.  Nursing note and vitals reviewed.    ED Treatments / Results  DIAGNOSTIC STUDIES: Oxygen Saturation is 100% on RA, normal by my interpretation.    COORDINATION OF CARE: 7:42 PM Discussed treatment plan with pt at bedside and pt agreed to plan.  Labs (all labs ordered are listed, but only abnormal results are displayed) Labs Reviewed  URINALYSIS, ROUTINE W REFLEX MICROSCOPIC - Abnormal; Notable for the following:       Result Value   APPearance HAZY (*)    Protein, ur 30 (*)    Squamous Epithelial / LPF 6-30 (*)    All other components within normal limits  POC URINE PREG, ED - Abnormal; Notable for the following:    Preg Test, Ur POSITIVE (*)    All other components within normal limits    EKG  EKG Interpretation None       Radiology No results found.  Procedures Procedures (  including critical care time)  Medications Ordered in ED Medications - No data to display   Initial Impression / Assessment and Plan / ED Course  I have reviewed the triage vital signs and the nursing notes.  Pertinent labs & imaging results that were available during my care of the patient were reviewed by me and considered in my medical decision making (see chart for details).  Clinical Course    30yo female presents with desire for confirmation of pregnancy. Patient with positive  pregnancy test. No abdominal pain, no vaginal bleeding and nor reason to suspect ectopic at this time. On review of systems, does acknowledge mild vaginal discharge today. Discussed possible pelvic exam, however this may also be done closely as outpt and this is pt preference. Urinalysis negative. Patient discharged in stable condition with understanding of reasons to return.   Final Clinical Impressions(s) / ED Diagnoses   Final diagnoses:  Less than [redacted] weeks gestation of pregnancy    New Prescriptions There are no discharge medications for this patient.  I personally performed the services described in this documentation, which was scribed in my presence. The recorded information has been reviewed and is accurate.     Alvira MondayErin Ashutosh Dieguez, MD 11/22/16 0000

## 2016-12-03 ENCOUNTER — Encounter (HOSPITAL_BASED_OUTPATIENT_CLINIC_OR_DEPARTMENT_OTHER): Payer: Self-pay | Admitting: *Deleted

## 2016-12-03 ENCOUNTER — Emergency Department (HOSPITAL_BASED_OUTPATIENT_CLINIC_OR_DEPARTMENT_OTHER)
Admission: EM | Admit: 2016-12-03 | Discharge: 2016-12-03 | Disposition: A | Discharge status: Home / Self Care | Payer: BLUE CROSS/BLUE SHIELD | Attending: Emergency Medicine | Admitting: Emergency Medicine

## 2016-12-03 DIAGNOSIS — M545 Low back pain, unspecified: Secondary | ICD-10-CM

## 2016-12-03 DIAGNOSIS — O26891 Other specified pregnancy related conditions, first trimester: Secondary | ICD-10-CM | POA: Diagnosis not present

## 2016-12-03 DIAGNOSIS — Z3A01 Less than 8 weeks gestation of pregnancy: Secondary | ICD-10-CM | POA: Diagnosis not present

## 2016-12-03 LAB — URINALYSIS, ROUTINE W REFLEX MICROSCOPIC
BILIRUBIN URINE: NEGATIVE
Glucose, UA: NEGATIVE mg/dL
HGB URINE DIPSTICK: NEGATIVE
KETONES UR: NEGATIVE mg/dL
Leukocytes, UA: NEGATIVE
NITRITE: NEGATIVE
PH: 5.5 (ref 5.0–8.0)
Protein, ur: NEGATIVE mg/dL
SPECIFIC GRAVITY, URINE: 1.027 (ref 1.005–1.030)

## 2016-12-03 NOTE — ED Triage Notes (Signed)
Pt with low back pain x 20 min denies injury describes as a tightness. Reports to the ED per providers instructions for back pain

## 2016-12-03 NOTE — ED Provider Notes (Signed)
[facility]   CSN: [id number] Arrival date & time: 12/03/16  0005     History   Chief Complaint Chief Complaint  Patient presents with  . Back Pain    HPI Caroline Weber is a 30 y.o. female.  Patient is a 30 year old female G1 at approximately [redacted] weeks gestation. She presents for evaluation of low back pain that started approximately 20 minutes prior to presentation. She states she was at work this evening when she developed stiffness in her lower back. There is no abdominal or pelvic pain. She is not having any discharge or vaginal bleeding. She is having no radiation into her legs. She found out 10 days ago she was pregnant and was told to come to the ER if she experienced any pain. Eyes any fevers or chills. She denies any urinary complaints.  She has an appointment for an ultrasound early next week.   The history is provided by the patient.  Back Pain   This is a new problem. The current episode started less than 1 hour ago. The problem occurs constantly. The problem has not changed since onset.The pain is associated with no known injury. The pain is present in the lumbar spine. Quality: Stiffness. The pain does not radiate. The symptoms are aggravated by bending and twisting. Pertinent negatives include no bowel incontinence, no bladder incontinence, no dysuria and no pelvic pain. She has tried nothing for the symptoms.    History reviewed. No pertinent past medical history.  There are no active problems to display for this patient.   History reviewed. No pertinent surgical history.  OB History    No data available       Home Medications    Prior to Admission medications   Medication Sig Start Date End Date Taking? Authorizing Provider  Prenatal Vit-Fe Fumarate-FA (PRENATAL MULTIVITAMIN) TABS tablet Take 1 tablet by mouth daily at 12 noon.   Yes Historical Provider, MD    Family History History reviewed. No pertinent family  history.  Social History Social History  Substance Use Topics  . Smoking status: Never Smoker  . Smokeless tobacco: Never Used  . Alcohol use No     Comment: occ     Allergies   Patient has no known allergies.   Review of Systems Review of Systems  Gastrointestinal: Negative for bowel incontinence.  Genitourinary: Negative for bladder incontinence, dysuria and pelvic pain.  Musculoskeletal: Positive for back pain.  All other systems reviewed and are negative.    Physical Exam Updated Vital Signs BP 121/86   Pulse 81   Temp 98 F (36.7 C) (Oral)   Resp 18   Ht 5\' 3"  (1.6 m)   Wt 176 lb (79.8 kg)   SpO2 99%   BMI 31.18 kg/m   Physical Exam  Constitutional: She is oriented to person, place, and time. She appears well-developed and well-nourished. No distress.  HENT:  Head: Normocephalic and atraumatic.  Neck: Normal range of motion. Neck supple.  Cardiovascular: Normal rate and regular rhythm.  Exam reveals no gallop and no friction rub.   No murmur heard. Pulmonary/Chest: Effort normal and breath sounds normal. No respiratory distress. She has no wheezes.  Abdominal: Soft. Bowel sounds are normal. She exhibits no distension. There is no tenderness.  Musculoskeletal: Normal range of motion.  There is mild tenderness in the soft tissues of the lumbar region. There is no bony tenderness or step-off.  Neurological: She is alert and oriented to person,  place, and time.  Strength is 5 out of 5 in both lower extremities and she is ambulatory without difficulty.  Skin: Skin is warm and dry. She is not diaphoretic.  Nursing note and vitals reviewed.    ED Treatments / Results  Labs (all labs ordered are listed, but only abnormal results are displayed) Labs Reviewed  URINALYSIS, ROUTINE W REFLEX MICROSCOPIC - Abnormal; Notable for the following:       Result Value   APPearance CLOUDY (*)    All other components within normal limits    EKG  EKG  Interpretation None       Radiology No results found.  Procedures Procedures (including critical care time)  Medications Ordered in ED Medications - No data to display   Initial Impression / Assessment and Plan / ED Course  I have reviewed the triage vital signs and the nursing notes.  Pertinent labs & imaging results that were available during my care of the patient were reviewed by me and considered in my medical decision making (see chart for details).  Clinical Course    Physical examination reveals tenderness to palpation in the soft tissues of the lumbar region. There is no abdominal tenderness and she is not having any vaginal bleeding or spotting. I highly doubt any sort of pregnancy complication. I see no indication for imaging or further workup. This is most certainly musculoskeletal in nature. She will be discharged with Tylenol and when necessary follow-up. She has an appointment on Tuesday for an ultrasound and I will advise her to keep this. If she develops lower abdominal pain or bleeding, she is to return to the ER to be reevaluated.  Final Clinical Impressions(s) / ED Diagnoses   Final diagnoses:  None    New Prescriptions New Prescriptions   No medications on file     Geoffery Lyonsouglas Emry Barbato, MD 12/03/16 [phone]

## 2016-12-03 NOTE — Discharge Instructions (Signed)
Tylenol 1000 mg every 6 hours as needed for pain.  Return to the emergency department if you develop worsening pain, abdominal or pelvic pain, vaginal bleeding, or other new and concerning symptoms.

## 2017-01-12 ENCOUNTER — Emergency Department (HOSPITAL_BASED_OUTPATIENT_CLINIC_OR_DEPARTMENT_OTHER)
Admission: EM | Admit: 2017-01-12 | Discharge: 2017-01-12 | Disposition: A | Discharge status: Home / Self Care | Payer: BLUE CROSS/BLUE SHIELD | Attending: Emergency Medicine | Admitting: Emergency Medicine

## 2017-01-12 ENCOUNTER — Encounter (HOSPITAL_BASED_OUTPATIENT_CLINIC_OR_DEPARTMENT_OTHER): Payer: Self-pay

## 2017-01-12 DIAGNOSIS — Z3A10 10 weeks gestation of pregnancy: Secondary | ICD-10-CM | POA: Insufficient documentation

## 2017-01-12 DIAGNOSIS — O209 Hemorrhage in early pregnancy, unspecified: Secondary | ICD-10-CM | POA: Diagnosis present

## 2017-01-12 DIAGNOSIS — O039 Complete or unspecified spontaneous abortion without complication: Secondary | ICD-10-CM

## 2017-01-12 DIAGNOSIS — N939 Abnormal uterine and vaginal bleeding, unspecified: Secondary | ICD-10-CM

## 2017-01-12 LAB — CBC
HCT: 38.5 % (ref 36.0–46.0)
Hemoglobin: 13.4 g/dL (ref 12.0–15.0)
MCH: 30.2 pg (ref 26.0–34.0)
MCHC: 34.8 g/dL (ref 30.0–36.0)
MCV: 86.9 fL (ref 78.0–100.0)
PLATELETS: 310 10*3/uL (ref 150–400)
RBC: 4.43 MIL/uL (ref 3.87–5.11)
RDW: 12.8 % (ref 11.5–15.5)
WBC: 18 10*3/uL — AB (ref 4.0–10.5)

## 2017-01-12 LAB — WET PREP, GENITAL
Clue Cells Wet Prep HPF POC: NONE SEEN
Sperm: NONE SEEN
Trich, Wet Prep: NONE SEEN
YEAST WET PREP: NONE SEEN

## 2017-01-12 LAB — ABO/RH: ABO/RH(D): O POS

## 2017-01-12 MED ORDER — HYDROCODONE-ACETAMINOPHEN 5-325 MG PO TABS: ORAL_TABLET | ORAL | 0 refills | Status: DC

## 2017-01-12 MED ORDER — ONDANSETRON HCL 4 MG/2ML IJ SOLN
4.0000 mg | Freq: Once | INTRAMUSCULAR | Status: AC
  Administered 2017-01-12: 4 mg via INTRAVENOUS
  Filled 2017-01-12: qty 2

## 2017-01-12 MED ORDER — HYDROMORPHONE HCL 1 MG/ML IJ SOLN
0.5000 mg | Freq: Once | INTRAMUSCULAR | Status: AC
  Administered 2017-01-12: 0.5 mg via INTRAVENOUS
  Filled 2017-01-12: qty 1

## 2017-01-12 MED ORDER — NAPROXEN 500 MG PO TABS: 500.0000 mg | ORAL_TABLET | Freq: Two times a day (BID) | ORAL | 0 refills | Status: DC

## 2017-01-12 MED ORDER — HYDROCODONE-ACETAMINOPHEN 5-325 MG PO TABS
1.0000 | ORAL_TABLET | Freq: Once | ORAL | Status: AC
  Administered 2017-01-12: 1 via ORAL
  Filled 2017-01-12: qty 1

## 2017-01-12 NOTE — ED Triage Notes (Signed)
Pt states she was [redacted] weeks pregnant 1/23[facility]-declined meds or D&C-started spotting 6 days ago-heavy bleeding with clots x today-pad #2 in place with bleed through in triage-pt given pad/underwear and paper scrubs-pt NAD-steady gait

## 2017-01-12 NOTE — ED Provider Notes (Signed)
[facility]   CSN: [id number] Arrival date & time: 01/12/17  1554  By signing my name below, I, Marnette Burgess Long, attest that this documentation has been prepared under the direction and in the presence of Renne Crigler, PA-C. Electronically Signed: Marnette Burgess Long, Scribe. 01/12/2017. 5:53 PM.   History   Chief Complaint Chief Complaint  Patient presents with  . Vaginal Bleeding  . Miscarriage    The history is provided by the patient. No language interpreter was used.    HPI Comments:  Caroline Weber is a 31 y.o. female G:1 P:0 A:0 with no pertinent PMHx, who presents to the Emergency Department complaining of constant, heavy, vaginal bleeding onset today. Pt reports being seen at her OBGYN on 12/28/16 at which time she was ten weeks pregnant. An US of the A/P was performed and revealed no fetal heartbeat. Pt declined D&C and medication. She noticed onset of spotted vaginal bleeding six days ago with her symptoms gradually worsening today PTA. She has associated symptoms of abdominal cramping, abdominal pain, and back pain. No home Tx's tried for relief of her symptoms. No h/o abdominal surgery. Pt denies syncope, chest pain, fatigue, SOB, and any other associated symptoms at this time.    History reviewed. No pertinent past medical history.  There are no active problems to display for this patient.   History reviewed. No pertinent surgical history.  OB History    No data available      Home Medications    Prior to Admission medications   Medication Sig Start Date End Date Taking? Authorizing Provider  HYDROcodone-acetaminophen (NORCO/VICODIN) 5-325 MG tablet Take 1-2 tablets every 6 hours as needed for severe pain 01/12/17   Renne Crigler, PA-C  naproxen (NAPROSYN) 500 MG tablet Take 1 tablet (500 mg total) by mouth 2 (two) times daily. 01/12/17   Renne Crigler, PA-C    Family History No family history on file.  Social History Social History    Substance Use Topics  . Smoking status: Never Smoker  . Smokeless tobacco: Never Used  . Alcohol use No     Comment: occ     Allergies   Patient has no known allergies.   Review of Systems Review of Systems  Constitutional: Negative for fever.  HENT: Negative for rhinorrhea and sore throat.   Eyes: Negative for redness.  Respiratory: Negative for cough.   Cardiovascular: Negative for chest pain.  Gastrointestinal: Positive for abdominal pain. Negative for diarrhea, nausea and vomiting.  Genitourinary: Positive for vaginal bleeding. Negative for dysuria.  Musculoskeletal: Positive for back pain. Negative for myalgias.  Skin: Negative for rash.  Neurological: Negative for syncope and headaches.     Physical Exam Updated Vital Signs BP 127/80 (BP Location: Left Arm)   Pulse 85   Temp 98.9 F (37.2 C) (Oral)   Resp 18   Ht 5\' 3"  (1.6 m)   Wt 169 lb (76.7 kg)   SpO2 100%   BMI 29.94 kg/m   Physical Exam  Constitutional: She is oriented to person, place, and time. She appears well-developed and well-nourished.  HENT:  Head: Normocephalic.  Eyes: Conjunctivae are normal.  Cardiovascular: Normal rate.   Pulmonary/Chest: Effort normal.  Abdominal: She exhibits no distension.  Genitourinary: Pelvic exam was performed with patient supine. Uterus is tender. Cervix exhibits discharge (blood). Cervix exhibits no motion tenderness. Right adnexum displays no mass and no tenderness. Left adnexum displays no mass and no tenderness. There is bleeding in  the vagina. Vaginal discharge found.  Genitourinary Comments: At time of exam, clots noted in vagina. These were evacuated. No brisk bleeding noted from cervix.   Musculoskeletal: Normal range of motion.  Neurological: She is alert and oriented to person, place, and time.  Skin: Skin is warm and dry.  Psychiatric: She has a normal mood and affect.  Nursing note and vitals reviewed.    ED Treatments / Results  DIAGNOSTIC  STUDIES:  Oxygen Saturation is 100% on RA, normal by my interpretation.    COORDINATION OF CARE:  5:25 PM Discussed treatment plan with pt at bedside including pelvic exam and pain medication (Dilaudid) and pt agreed to plan.  Labs (all labs ordered are listed, but only abnormal results are displayed) Labs Reviewed  WET PREP, GENITAL - Abnormal; Notable for the following:       Result Value   WBC, Wet Prep HPF POC FEW (*)    All other components within normal limits  CBC - Abnormal; Notable for the following:    WBC 18.0 (*)    All other components within normal limits  ABO/RH  GC/CHLAMYDIA PROBE AMP ([place]) NOT AT [facility]    Procedures Procedures (including critical care time)  Medications Ordered in ED Medications  HYDROmorphone (DILAUDID) injection 0.5 mg (0.5 mg Intravenous Given 01/12/17 1743)  ondansetron (ZOFRAN) injection 4 mg (4 mg Intravenous Given 01/12/17 1743)  HYDROmorphone (DILAUDID) injection 0.5 mg (0.5 mg Intravenous Given 01/12/17 1837)  HYDROcodone-acetaminophen (NORCO/VICODIN) 5-325 MG per tablet 1 tablet (1 tablet Oral Given 01/12/17 2105)     Initial Impression / Assessment and Plan / ED Course  I have reviewed the triage vital signs and the nursing notes.  Pertinent labs & imaging results that were available during my care of the patient were reviewed by me and considered in my medical decision making (see chart for details).     Vital signs reviewed and are as follows: Vitals:   01/12/17 1822 01/12/17 2033  BP: 110/71 101/67  Pulse: 66 72  Resp: 16 16  Temp:     After pain control measures given, pelvic exam was performed with RN chaperone.  Orthostatics were checked, patient was normal.   She will call her doctor tomorrow. She has scheduled follow-up in 2 days. Patient told to return to the emergency department with lightheadedness, especially with standing, syncope, chest pain, shortness of breath.   Orthostatic VS for the past 24 hrs:   BP- Lying Pulse- Lying BP- Sitting Pulse- Sitting BP- Standing at 0 minutes Pulse- Standing at 0 minutes  01/12/17 2054 105/68 56 105/72 62 103/70 81   Patient counseled on use of narcotic pain medications. Counseled not to combine these medications with others containing tylenol. Urged not to drink alcohol, drive, or perform any other activities that requires focus while taking these medications. The patient verbalizes understanding and agrees with the plan.     Final Clinical Impressions(s) / ED Diagnoses   Final diagnoses:  Spontaneous miscarriage  Vaginal bleeding   Patient with active miscarriage. Pain controlled. Patient with vaginal bleeding however she is not anemic. Orthostatics are reassuring. No brisk bleeding. Pt has appropriate follow-up.   New Prescriptions Discharge Medication List as of 01/12/2017  9:53 PM    START taking these medications   Details  HYDROcodone-acetaminophen (NORCO/VICODIN) 5-325 MG tablet Take 1-2 tablets every 6 hours as needed for severe pain, Print    naproxen (NAPROSYN) 500 MG tablet Take 1 tablet (500 mg total) by mouth  2 (two) times daily., Starting Wed 01/12/2017, Print       I personally performed the services described in this documentation, which was scribed in my presence. The recorded information has been reviewed and is accurate.     Renne CriglerJoshua Rainn Bullinger, PA-C 01/12/17 2356    Doug SouSam Jacubowitz, MD 01/12/17 [phone]

## 2017-01-12 NOTE — Discharge Instructions (Signed)
Please read and follow all provided instructions.  Your diagnoses today include:  1. Spontaneous miscarriage   2. Vaginal bleeding    Tests performed today include:  Blood counts - normal hemoglobin  Vital signs. See below for your results today.   Medications prescribed:   Vicodin (hydrocodone/acetaminophen) - narcotic pain medication  DO NOT drive or perform any activities that require you to be awake and alert because this medicine can make you drowsy. BE VERY CAREFUL not to take multiple medicines containing Tylenol (also called acetaminophen). Doing so can lead to an overdose which can damage your liver and cause liver failure and possibly death.   Naproxen - anti-inflammatory pain medication  Do not exceed 500mg  naproxen every 12 hours, take with food  You have been prescribed an anti-inflammatory medication or NSAID. Take with food. Take smallest effective dose for the shortest duration needed for your pain. Stop taking if you experience stomach pain or vomiting.   Take any prescribed medications only as directed.  Home care instructions:  Follow any educational materials contained in this packet.  BE VERY CAREFUL not to take multiple medicines containing Tylenol (also called acetaminophen). Doing so can lead to an overdose which can damage your liver and cause liver failure and possibly death.   Follow-up instructions: Please follow-up with your GYN in 2 days as planned. Call tomorrow to let them know you were in the ER.   Return instructions:   Please return to the Emergency Department if you experience worsening symptoms.   Return if you develop lightheadedness, especially with standing, shortness of breath, chest pain, fatigue.  Return if you pass out.  Please return if you have any other emergent concerns.  Additional Information:  Your vital signs today were: BP 101/67 (BP Location: Left Arm)    Pulse 72    Temp 98.9 F (37.2 C) (Oral)    Resp 16    Ht 5'  3" (1.6 m)    Wt 76.7 kg    SpO2 100%    BMI 29.94 kg/m  If your blood pressure (BP) was elevated above 135/85 this visit, please have this repeated by your doctor within one month. --------------

## 2017-01-12 NOTE — ED Notes (Signed)
Pt awaits md eval, talking with friends at bedside.

## 2017-01-13 LAB — GC/CHLAMYDIA PROBE AMP (~~LOC~~) NOT AT ARMC
Chlamydia: NEGATIVE
Neisseria Gonorrhea: NEGATIVE

## 2017-03-19 ENCOUNTER — Encounter (HOSPITAL_BASED_OUTPATIENT_CLINIC_OR_DEPARTMENT_OTHER): Payer: Self-pay | Admitting: Emergency Medicine

## 2017-03-19 ENCOUNTER — Emergency Department (HOSPITAL_BASED_OUTPATIENT_CLINIC_OR_DEPARTMENT_OTHER)
Admission: EM | Admit: 2017-03-19 | Discharge: 2017-03-19 | Disposition: A | Discharge status: Home / Self Care | Payer: BLUE CROSS/BLUE SHIELD | Attending: Emergency Medicine | Admitting: Emergency Medicine

## 2017-03-19 DIAGNOSIS — Z3202 Encounter for pregnancy test, result negative: Secondary | ICD-10-CM | POA: Diagnosis not present

## 2017-03-19 DIAGNOSIS — N939 Abnormal uterine and vaginal bleeding, unspecified: Secondary | ICD-10-CM | POA: Insufficient documentation

## 2017-03-19 DIAGNOSIS — Z3200 Encounter for pregnancy test, result unknown: Secondary | ICD-10-CM | POA: Diagnosis present

## 2017-03-19 DIAGNOSIS — Z791 Long term (current) use of non-steroidal anti-inflammatories (NSAID): Secondary | ICD-10-CM | POA: Insufficient documentation

## 2017-03-19 LAB — URINALYSIS, ROUTINE W REFLEX MICROSCOPIC
BILIRUBIN URINE: NEGATIVE
Glucose, UA: NEGATIVE mg/dL
Hgb urine dipstick: NEGATIVE
Ketones, ur: NEGATIVE mg/dL
Leukocytes, UA: NEGATIVE
NITRITE: NEGATIVE
PH: 6 (ref 5.0–8.0)
PROTEIN: NEGATIVE mg/dL
Specific Gravity, Urine: 1.005 (ref 1.005–1.030)

## 2017-03-19 LAB — PREGNANCY, URINE: Preg Test, Ur: NEGATIVE

## 2017-03-19 NOTE — ED Provider Notes (Signed)
[facility]   CSN: [id number] Arrival date & time: 03/19/17  2033  By signing my name below, I, Caroline Weber, attest that this documentation has been prepared under the direction and in the presence of Linwood Dibbles, MD. Electronically Signed: Modena Weber, Scribe. 03/19/2017. 10:26 PM.  History   Chief Complaint Chief Complaint  Patient presents with  . Possible Pregnancy   The history is provided by the patient. No language interpreter was used.   HPI Comments: Caroline Weber is a 31 y.o. female who presents to the Emergency Department complaining of possible pregnancy. She states she had a natural miscarriage toward the end of January 2018. She had her hormones checked after and they were normal. Her LNMP was between 02/14/17 and 02/21/17. She had a positive pregnancy test 3 days ago, and then had vaginal bleeding 2 days ago and yesterday. She came to the ED for a pregnancy test. Denies any fever, abdominal pain, or other complaints at this time.    PCP: Iona Hansen, NP  History reviewed. No pertinent past medical history.  There are no active problems to display for this patient.   History reviewed. No pertinent surgical history.  OB History    No data available       Home Medications    Prior to Admission medications   Medication Sig Start Date End Date Taking? Authorizing Provider  HYDROcodone-acetaminophen (NORCO/VICODIN) 5-325 MG tablet Take 1-2 tablets every 6 hours as needed for severe pain 01/12/17   Renne Crigler, PA-C  naproxen (NAPROSYN) 500 MG tablet Take 1 tablet (500 mg total) by mouth 2 (two) times daily. 01/12/17   Renne Crigler, PA-C    Family History No family history on file.  Social History Social History  Substance Use Topics  . Smoking status: Never Smoker  . Smokeless tobacco: Never Used  . Alcohol use No     Comment: occ     Allergies   Patient has no known allergies.   Review of Systems Review of  Systems  Constitutional: Negative for fever.  Gastrointestinal: Negative for abdominal pain.  Genitourinary: Positive for vaginal bleeding.     Physical Exam Updated Vital Signs BP 119/75 (BP Location: Left Arm)   Pulse 78   Temp 98.1 F (36.7 C) (Oral)   Resp 17   Ht  (1.6 m)   Wt 171 lb 3 oz (77.7 kg)   LMP 03/17/2017   SpO2 98%   BMI 30.32 kg/m   Physical Exam  Constitutional: She appears well-developed and well-nourished. No distress.  HENT:  Head: Normocephalic and atraumatic.  Right Ear: External ear normal.  Left Ear: External ear normal.  Eyes: Conjunctivae are normal. Right eye exhibits no discharge. Left eye exhibits no discharge. No scleral icterus.  Neck: Neck supple. No tracheal deviation present.  Cardiovascular: Normal rate.   Pulmonary/Chest: Effort normal. No stridor. No respiratory distress.  Abdominal: Soft. She exhibits no distension and no mass. There is no tenderness. There is no rebound and no guarding.  Musculoskeletal: She exhibits no edema.  Neurological: She is alert. Cranial nerve deficit: no gross deficits.  Skin: Skin is warm and dry. No rash noted.  Psychiatric: She has a normal mood and affect.  Nursing note and vitals reviewed.    ED Treatments / Results  DIAGNOSTIC STUDIES: Oxygen Saturation is 98% on RA, normal by my interpretation.    COORDINATION OF CARE: 10:30 PM- Pt advised of plan for treatment and pt  agrees.  Labs (all labs ordered are listed, but only abnormal results are displayed) Labs Reviewed  URINALYSIS, ROUTINE W REFLEX MICROSCOPIC  PREGNANCY, URINE   Procedures Procedures (including critical care time)  Medications Ordered in ED Medications - No data to display   Initial Impression / Assessment and Plan / ED Course  I have reviewed the triage vital signs and the nursing notes.  Pertinent labs & imaging results that were available during my care of the patient were reviewed by me and considered in my  medical decision making (see chart for details).   Pt came to the ED to make sure she is not pregnant.  She denies any complaints at this time.  Preg test negative.    At this time there does not appear to be any evidence of an acute emergency medical condition and the patient appears stable for discharge with appropriate outpatient follow up.   Final Clinical Impressions(s) / ED Diagnoses   Final diagnoses:  Negative pregnancy test    New Prescriptions New Prescriptions   No medications on file   I personally performed the services described in this documentation, which was scribed in my presence.  The recorded information has been reviewed and is accurate.     Linwood Dibbles, MD 03/20/17 [phone]

## 2017-03-19 NOTE — ED Triage Notes (Addendum)
PT presents to ED with concern of being pregnant. PT states she had miscarriage in Jan of this year and had a period on March 12-19 then  This past Wednesday had a positive pregnancy test but thought she was having another period Thursday but it only lasted Thursday and Friday. No bleeding today .

## 2017-04-22 ENCOUNTER — Emergency Department (HOSPITAL_BASED_OUTPATIENT_CLINIC_OR_DEPARTMENT_OTHER)
Admission: EM | Admit: 2017-04-22 | Discharge: 2017-04-22 | Disposition: A | Discharge status: Home / Self Care | Payer: BLUE CROSS/BLUE SHIELD | Attending: Emergency Medicine | Admitting: Emergency Medicine

## 2017-04-22 ENCOUNTER — Emergency Department (HOSPITAL_BASED_OUTPATIENT_CLINIC_OR_DEPARTMENT_OTHER): Payer: BLUE CROSS/BLUE SHIELD

## 2017-04-22 ENCOUNTER — Encounter (HOSPITAL_BASED_OUTPATIENT_CLINIC_OR_DEPARTMENT_OTHER): Payer: Self-pay | Admitting: Emergency Medicine

## 2017-04-22 DIAGNOSIS — N939 Abnormal uterine and vaginal bleeding, unspecified: Secondary | ICD-10-CM

## 2017-04-22 DIAGNOSIS — O200 Threatened abortion: Secondary | ICD-10-CM

## 2017-04-22 DIAGNOSIS — B9689 Other specified bacterial agents as the cause of diseases classified elsewhere: Secondary | ICD-10-CM

## 2017-04-22 DIAGNOSIS — O209 Hemorrhage in early pregnancy, unspecified: Secondary | ICD-10-CM | POA: Insufficient documentation

## 2017-04-22 DIAGNOSIS — N760 Acute vaginitis: Secondary | ICD-10-CM | POA: Diagnosis not present

## 2017-04-22 LAB — URINALYSIS, MICROSCOPIC (REFLEX)
RBC / HPF: NONE SEEN RBC/hpf (ref 0–5)
WBC UA: NONE SEEN WBC/hpf (ref 0–5)

## 2017-04-22 LAB — ABO/RH: ABO/RH(D): O POS

## 2017-04-22 LAB — URINALYSIS, ROUTINE W REFLEX MICROSCOPIC
Bilirubin Urine: NEGATIVE
Glucose, UA: NEGATIVE mg/dL
Ketones, ur: NEGATIVE mg/dL
LEUKOCYTES UA: NEGATIVE
NITRITE: NEGATIVE
PROTEIN: NEGATIVE mg/dL
Specific Gravity, Urine: 1.012 (ref 1.005–1.030)
pH: 6.5 (ref 5.0–8.0)

## 2017-04-22 LAB — PREGNANCY, URINE: Preg Test, Ur: POSITIVE — AB

## 2017-04-22 LAB — HCG, QUANTITATIVE, PREGNANCY: hCG, Beta Chain, Quant, S: 574 m[IU]/mL — ABNORMAL HIGH (ref <5)

## 2017-04-22 LAB — WET PREP, GENITAL
SPERM: NONE SEEN
Trich, Wet Prep: NONE SEEN
YEAST WET PREP: NONE SEEN

## 2017-04-22 MED ORDER — METRONIDAZOLE 500 MG PO TABS: 500.0000 mg | ORAL_TABLET | Freq: Two times a day (BID) | ORAL | 0 refills | Status: DC

## 2017-04-22 NOTE — Discharge Instructions (Signed)
Please read instructions below. [facility] an appointment with [facility] for a visit in 2 days to recheck your b-HCG labs.  Return to the ER for abdominal cramping, worsening bleeding, or new or worsening symptoms.

## 2017-04-22 NOTE — ED Notes (Signed)
Pt. Reports she went to a clinic around 1700 after she felt a wet sensation in her panties and she found out she was pregnant on Monday. LMP was Thurs and ended on Tues.  NOT  Normal periods ever per Pt.

## 2017-04-22 NOTE — ED Provider Notes (Signed)
[facility]   CSN: [id number] Arrival date & time: 04/22/17  1735   By signing my name below, I, Clarisse GougeXavier Herndon, attest that this documentation has been prepared under the direction and in the presence of [place] R Russo, PA-C. Electronically Signed: Clarisse GougeXavier Herndon, Scribe. 04/22/17. 8:30 PM.   History   Chief Complaint Chief Complaint  Patient presents with  . Vaginal Bleeding   The history is provided by the patient and medical records. No language interpreter was used.    Caroline Weber is a 31 y.o. female with h/o miscarriage who presents to the Emergency Department with concern for sudden vaginal bleeding onset ~5 PM today. Some fatigue noted. Unrelated sinus congestion noted. Pt allegedly found out she was pregnant 3 days ago. H/o miscarriage alleged in 01/2017. 1st OB/GYN appointment scheduled for 05/04/2017. LNMP 8 days ago lasting until 3 days ago. H/o irregular period noted; she states her period has been regular x 1 year. No birth control use noted. No pelvic pain, chest pain, fever, N/V, abdominal cramping noted. No other complaints at this time.   History reviewed. No pertinent past medical history.  There are no active problems to display for this patient.   History reviewed. No pertinent surgical history.  OB History    No data available       Home Medications    Prior to Admission medications   Medication Sig Start Date End Date Taking? Authorizing Provider  HYDROcodone-acetaminophen (NORCO/VICODIN) 5-325 MG tablet Take 1-2 tablets every 6 hours as needed for severe pain 01/12/17   Renne CriglerGeiple, Joshua, PA-C  metroNIDAZOLE (FLAGYL) 500 MG tablet Take 1 tablet (500 mg total) by mouth 2 (two) times daily. 04/22/17   Russo, [place] N, PA-C  naproxen (NAPROSYN) 500 MG tablet Take 1 tablet (500 mg total) by mouth 2 (two) times daily. 01/12/17   Renne CriglerGeiple, Joshua, PA-C    Family History No family history on file.  Social History Social History    Substance Use Topics  . Smoking status: Never Smoker  . Smokeless tobacco: Never Used  . Alcohol use No     Comment: occ     Allergies   Patient has no known allergies.   Review of Systems Review of Systems  Constitutional: Negative for fever.  Cardiovascular: Negative for chest pain.  Gastrointestinal: Negative for abdominal pain, nausea and vomiting.  Genitourinary: Positive for vaginal bleeding. Negative for pelvic pain.  All other systems reviewed and are negative.    Physical Exam Updated Vital Signs BP (!) 131/91 (BP Location: Right Arm)   Pulse 74   Temp 98.8 F (37.1 C) (Oral)   Resp 18   Ht 5\' 3"  (1.6 m)   Wt 170 lb (77.1 kg)   LMP 04/14/2017   SpO2 100%   BMI 30.11 kg/m   Physical Exam  Constitutional: She appears well-developed and well-nourished.  HENT:  Head: Normocephalic and atraumatic.  Eyes: Conjunctivae are normal.  Neck: Normal range of motion.  Cardiovascular: Normal rate, regular rhythm, normal heart sounds and intact distal pulses.   Pulmonary/Chest: Effort normal and breath sounds normal. No respiratory distress. She has no wheezes. She has no rales.  Abdominal: Soft. Bowel sounds are normal. She exhibits no distension. There is no tenderness. There is no rebound and no guarding.  Genitourinary: Uterus normal. There is no tenderness on the right labia. There is no tenderness on the left labia. Cervix exhibits no motion tenderness and no friability. Right adnexum displays no  tenderness. Left adnexum displays no tenderness. Vaginal discharge (Dark brown malodorous discharge in the vaginal vault.) found.  Genitourinary Comments: Exam performed with chaperone, RN present. Cervical os is closed.  Neurological: She is alert.  Skin: Skin is warm.  Psychiatric: She has a normal mood and affect. Her behavior is normal.  Nursing note and vitals reviewed.    ED Treatments / Results  DIAGNOSTIC STUDIES: Oxygen Saturation is 100% on RA, NL by my  interpretation.    COORDINATION OF CARE: 8:13 PM-Discussed next steps with pt. Pt verbalized understanding and is agreeable with the plan. Will order imaging. Pt prepared for female exam.   Labs (all labs ordered are listed, but only abnormal results are displayed) Labs Reviewed  WET PREP, GENITAL - Abnormal; Notable for the following:       Result Value   Clue Cells Wet Prep HPF POC PRESENT (*)    WBC, Wet Prep HPF POC MODERATE (*)    All other components within normal limits  PREGNANCY, URINE - Abnormal; Notable for the following:    Preg Test, Ur POSITIVE (*)    All other components within normal limits  URINALYSIS, ROUTINE W REFLEX MICROSCOPIC - Abnormal; Notable for the following:    Hgb urine dipstick MODERATE (*)    All other components within normal limits  URINALYSIS, MICROSCOPIC (REFLEX) - Abnormal; Notable for the following:    Bacteria, UA RARE (*)    Squamous Epithelial / LPF 0-5 (*)    All other components within normal limits  HCG, QUANTITATIVE, PREGNANCY - Abnormal; Notable for the following:    hCG, Beta Chain, Quant, S 574 (*)    All other components within normal limits  ABO/RH  GC/CHLAMYDIA PROBE AMP ([place]) NOT AT [facility]    EKG  EKG Interpretation None       Radiology US Ob Comp Less 14 Wks  Result Date: 04/22/2017 CLINICAL DATA:  Vaginal spotting EXAM: OBSTETRIC <14 WK [place] AND TRANSVAGINAL OB US TECHNIQUE: Both transabdominal and transvaginal ultrasound examinations were performed for complete evaluation of the gestation as well as the maternal uterus, adnexal regions, and pelvic cul-de-sac. Transvaginal technique was performed to assess early pregnancy. COMPARISON:  None. FINDINGS: Intrauterine gestational sac: Tiny intrauterine fluid collection may represent very early gestational sac. Yolk sac:  Not visualized Embryo:  Not visualized MSD: 1.8  mm   below the threshold for date estimation. Subchorionic hemorrhage:  None visualized. Maternal  uterus/adnexae: Bilateral ovaries are within normal limits. The left ovary measures 3 by a 4 x 1.8 cm. The right ovary measures 2.6 x 4.1 x 1.7 cm. Trace free fluid in the pelvis. IMPRESSION: Tiny focal intrauterine fluid collection may represent an extremely early gestational sac. No yolk sac, fetal pole, or cardiac activity yet visualized. Recommend follow-up quantitative B-HCG levels and follow-up US in 14 days to assess viability. This recommendation follows SRU consensus guidelines: Diagnostic Criteria for Nonviable Pregnancy Early in the First Trimester. Malva Limes Med 2013; 161:0960-45. Electronically Signed   By: Jasmine Pang M.D.   On: 04/22/2017 20:31   US Ob Transvaginal  Result Date: 04/22/2017 CLINICAL DATA:  Vaginal spotting EXAM: OBSTETRIC <14 WK [place] AND TRANSVAGINAL OB US TECHNIQUE: Both transabdominal and transvaginal ultrasound examinations were performed for complete evaluation of the gestation as well as the maternal uterus, adnexal regions, and pelvic cul-de-sac. Transvaginal technique was performed to assess early pregnancy. COMPARISON:  None. FINDINGS: Intrauterine gestational sac: Tiny intrauterine fluid collection may represent very early gestational  sac. Yolk sac:  Not visualized Embryo:  Not visualized MSD: 1.8  mm   below the threshold for date estimation. Subchorionic hemorrhage:  None visualized. Maternal uterus/adnexae: Bilateral ovaries are within normal limits. The left ovary measures 3 by a 4 x 1.8 cm. The right ovary measures 2.6 x 4.1 x 1.7 cm. Trace free fluid in the pelvis. IMPRESSION: Tiny focal intrauterine fluid collection may represent an extremely early gestational sac. No yolk sac, fetal pole, or cardiac activity yet visualized. Recommend follow-up quantitative B-HCG levels and follow-up US in 14 days to assess viability. This recommendation follows SRU consensus guidelines: Diagnostic Criteria for Nonviable Pregnancy Early in the First Trimester. Malva Limes Med 2013;  161:0960-45. Electronically Signed   By: Jasmine Pang M.D.   On: 04/22/2017 20:31    Procedures Procedures (including critical care time)  Medications Ordered in ED Medications - No data to display   Initial Impression / Assessment and Plan / ED Course  I have reviewed the triage vital signs and the nursing notes.  Pertinent labs & imaging results that were available during my care of the patient were reviewed by me and considered in my medical decision making (see chart for details).     Patient with vaginal bleeding, threatened abortion, and bacterial vaginosis. Pelvic exam was dark brown malodorous discharge, cervical os is closed. Ultrasound with small intrauterine fluid sac. Patient without abdominal cramping, afebrile,  hemodynamically stable. UA unremarkable. Wet prep with clue cells and WBC. Rh+. HCG 574. Will treat for BV with Flagyl. Patient to follow-up with women's clinic in 2 days for repeat hCG. Patient to return to ER sooner for worsening symptoms, abdominal cramping, or new or worsening symptoms. Pt safe for discharge.  Patient discussed with  Dr. Donnald Garre. Discussed results, findings, treatment and follow up. Patient advised of return precautions. Patient verbalized understanding and agreed with plan.  Final Clinical Impressions(s) / ED Diagnoses   Final diagnoses:  Vaginal bleeding  Threatened abortion in early pregnancy  BV (bacterial vaginosis)    New Prescriptions Discharge Medication List as of 04/22/2017  9:34 PM    START taking these medications   Details  metroNIDAZOLE (FLAGYL) 500 MG tablet Take 1 tablet (500 mg total) by mouth 2 (two) times daily., Starting Fri 04/22/2017, Print      I personally performed the services described in this documentation, which was scribed in my presence. The recorded information has been reviewed and is accurate.    Russo, [place] N, PA-C 04/22/17 2145    Arby Barrette, MD 04/23/17 [phone]

## 2017-04-22 NOTE — ED Triage Notes (Signed)
Pt had pos pregnancy test test Monday. Reports vaginal bleeding just prior to arrival when she wiped. Denies abd pain

## 2017-04-25 LAB — GC/CHLAMYDIA PROBE AMP (~~LOC~~) NOT AT ARMC
Chlamydia: NEGATIVE
NEISSERIA GONORRHEA: NEGATIVE

## 2017-04-27 ENCOUNTER — Other Ambulatory Visit: Payer: BLUE CROSS/BLUE SHIELD

## 2017-04-27 DIAGNOSIS — O200 Threatened abortion: Secondary | ICD-10-CM

## 2017-04-28 LAB — BETA HCG QUANT (REF LAB): hCG Quant: 2333 m[IU]/mL

## 2017-05-03 ENCOUNTER — Telehealth: Payer: Self-pay | Admitting: Advanced Practice Midwife

## 2017-05-03 DIAGNOSIS — O209 Hemorrhage in early pregnancy, unspecified: Secondary | ICD-10-CM

## 2017-05-03 DIAGNOSIS — O3680X0 Pregnancy with inconclusive fetal viability, not applicable or unspecified: Secondary | ICD-10-CM

## 2017-05-03 NOTE — Addendum Note (Signed)
Addended by: Dorathy KinsmanSMITH, Nael Petrosyan on: 05/03/2017 10:51 AM   Modules accepted: Orders

## 2017-05-03 NOTE — Telephone Encounter (Signed)
Called for results of hCG from 04/27/17. Normal rise since 5/18. Denies cramping or bleeding. Needs US in 1 week. Ectopic and SAB precautions.

## 2017-05-10 ENCOUNTER — Ambulatory Visit (HOSPITAL_COMMUNITY)
Admission: RE | Admit: 2017-05-10 | Discharge: 2017-05-10 | Disposition: A | Discharge status: Home / Self Care | Payer: BLUE CROSS/BLUE SHIELD | Source: Ambulatory Visit | Attending: Advanced Practice Midwife | Admitting: Advanced Practice Midwife

## 2017-05-10 DIAGNOSIS — O208 Other hemorrhage in early pregnancy: Secondary | ICD-10-CM | POA: Diagnosis not present

## 2017-05-10 DIAGNOSIS — Z3A01 Less than 8 weeks gestation of pregnancy: Secondary | ICD-10-CM | POA: Diagnosis not present

## 2017-05-10 DIAGNOSIS — O3680X0 Pregnancy with inconclusive fetal viability, not applicable or unspecified: Secondary | ICD-10-CM

## 2017-05-10 DIAGNOSIS — O209 Hemorrhage in early pregnancy, unspecified: Secondary | ICD-10-CM | POA: Diagnosis present

## 2017-05-11 ENCOUNTER — Telehealth: Payer: Self-pay | Admitting: *Deleted

## 2017-05-11 DIAGNOSIS — O3680X0 Pregnancy with inconclusive fetal viability, not applicable or unspecified: Secondary | ICD-10-CM

## 2017-05-11 NOTE — Telephone Encounter (Signed)
Called pt and informed her of US results showing early pregnancy but not far enough along to show the baby (fetus). Recommendation is made for repeat US. Pt also wanted to know results from hormone level done on 5/23. I gave her result and advised that the level had risen appropriately. Pt denied having abdominal or pelvic pain. She had been experiencing some light vaginal bleeding after intercourse so she is currently abstaining.  Pt voiced understanding of information given and agreed to US appt on 6/15 @ 0900.

## 2017-05-12 ENCOUNTER — Other Ambulatory Visit: Payer: BLUE CROSS/BLUE SHIELD

## 2017-05-12 DIAGNOSIS — Z3490 Encounter for supervision of normal pregnancy, unspecified, unspecified trimester: Secondary | ICD-10-CM

## 2017-05-13 LAB — BETA HCG QUANT (REF LAB): HCG QUANT: 16866 m[IU]/mL

## 2017-05-16 ENCOUNTER — Telehealth: Payer: Self-pay | Admitting: Family Medicine

## 2017-05-16 NOTE — Telephone Encounter (Signed)
Patient would like a call back about her lab results.

## 2017-05-17 NOTE — Telephone Encounter (Signed)
Spoke w/pt today regarding lab result from 6/7.  She was advised that the pregnancy hormone level did rise appropriately.  She should keep appt on 6/15 for US and schedule prenatal care accordingly.  Pt voiced understanding.

## 2017-05-20 ENCOUNTER — Ambulatory Visit: Payer: BLUE CROSS/BLUE SHIELD | Admitting: *Deleted

## 2017-05-20 ENCOUNTER — Telehealth: Payer: Self-pay | Admitting: *Deleted

## 2017-05-20 ENCOUNTER — Ambulatory Visit (HOSPITAL_COMMUNITY)
Admission: RE | Admit: 2017-05-20 | Discharge: 2017-05-20 | Disposition: A | Discharge status: Home / Self Care | Payer: BLUE CROSS/BLUE SHIELD | Source: Ambulatory Visit | Attending: Advanced Practice Midwife | Admitting: Advanced Practice Midwife

## 2017-05-20 DIAGNOSIS — O3680X0 Pregnancy with inconclusive fetal viability, not applicable or unspecified: Secondary | ICD-10-CM | POA: Diagnosis not present

## 2017-05-20 DIAGNOSIS — Z712 Person consulting for explanation of examination or test findings: Secondary | ICD-10-CM

## 2017-05-20 NOTE — Telephone Encounter (Signed)
Pt informed of appointment for u/s on 6/22 @ 1000. Understanding voiced.

## 2017-05-20 NOTE — Progress Notes (Signed)
Patient presents to clinic for u/s results. U/s shows 2 gest sacs, no fetus in either. Prior u/s 10 days ago showed 1 sac, no fetus. Spoke with Dr Marice Potterove who ordered a beta hcg today, f/u u/s in one week. Spoke with patient about results and recommendations. Understanding voiced. Pt to lab for beta hcg. U/s scheduled 6/22 @ 1000. Patient left clinic before I could give her the appointment details. Will call.

## 2017-05-21 LAB — BETA HCG QUANT (REF LAB): HCG QUANT: 17828 m[IU]/mL

## 2017-05-23 ENCOUNTER — Telehealth: Payer: Self-pay | Admitting: *Deleted

## 2017-05-23 NOTE — Telephone Encounter (Signed)
Patient called for bhcg results. After consulting with Dr Debroah LoopArnold, informed patient of results and that they did not show an appropriate rise. She is scheduled for u/s on Friday, per Dr Debroah LoopArnold she should keep that appointment. Patient voiced understanding.

## 2017-05-23 NOTE — Telephone Encounter (Signed)
[facility] and wanting to know EGA for pregnancy.  Pt will be needing some cavities filled and possible a root canal in the near future. Per chart review, pt had US on 6/15 which showed 2 gestational sacs but no fetus in either sac.  She has repeat US scheduled on 6/22. I stated that I cannot provide the requested letter until we have complete documentation of the pregnancy. I advised her to call back to office on 6/25.  She voiced understanding and agreed.

## 2017-05-27 ENCOUNTER — Ambulatory Visit (INDEPENDENT_AMBULATORY_CARE_PROVIDER_SITE_OTHER): Payer: BLUE CROSS/BLUE SHIELD | Admitting: Obstetrics and Gynecology

## 2017-05-27 ENCOUNTER — Ambulatory Visit (HOSPITAL_COMMUNITY)
Admission: RE | Admit: 2017-05-27 | Discharge: 2017-05-27 | Disposition: A | Discharge status: Home / Self Care | Payer: BLUE CROSS/BLUE SHIELD | Source: Ambulatory Visit | Attending: Obstetrics & Gynecology | Admitting: Obstetrics & Gynecology

## 2017-05-27 ENCOUNTER — Other Ambulatory Visit: Payer: Self-pay | Admitting: Obstetrics & Gynecology

## 2017-05-27 DIAGNOSIS — Z3A00 Weeks of gestation of pregnancy not specified: Secondary | ICD-10-CM | POA: Diagnosis not present

## 2017-05-27 DIAGNOSIS — O3680X0 Pregnancy with inconclusive fetal viability, not applicable or unspecified: Secondary | ICD-10-CM

## 2017-05-27 DIAGNOSIS — O0289 Other abnormal products of conception: Secondary | ICD-10-CM | POA: Diagnosis not present

## 2017-05-27 NOTE — Progress Notes (Signed)
31 yo [id number] LMP 04/14/17 who has been followed for determination of viable pregnancy. BHCG levels have not increased in appropriate fashion. U/S has demonstrated GS x1 and repeat with GS x2. U/S today confirms GS x 2 without FP Pt denies any VB or cramps H/O First trimester SAB this past Feb  PE AF VSS Lungs clear Heart RRR  A/P Probable non viable twin gestation  Management options reviewed with pt. Repeating U/S in 7-10 days or repeating BHCG today Will check BHCG today and if not rising will consider this to be a failed twin gestation and treat appropriate. Pt agreed with POC

## 2017-05-28 ENCOUNTER — Encounter: Payer: Self-pay | Admitting: Obstetrics and Gynecology

## 2017-05-28 LAB — BETA HCG QUANT (REF LAB): hCG Quant: 11018 m[IU]/mL

## 2017-05-31 ENCOUNTER — Encounter: Payer: Self-pay | Admitting: General Practice

## 2017-07-29 ENCOUNTER — Encounter (HOSPITAL_BASED_OUTPATIENT_CLINIC_OR_DEPARTMENT_OTHER): Payer: Self-pay | Admitting: Emergency Medicine

## 2017-07-29 ENCOUNTER — Emergency Department (HOSPITAL_BASED_OUTPATIENT_CLINIC_OR_DEPARTMENT_OTHER)
Admission: EM | Admit: 2017-07-29 | Discharge: 2017-07-29 | Disposition: A | Discharge status: Home / Self Care | Payer: BLUE CROSS/BLUE SHIELD | Attending: Emergency Medicine | Admitting: Emergency Medicine

## 2017-07-29 DIAGNOSIS — B9689 Other specified bacterial agents as the cause of diseases classified elsewhere: Secondary | ICD-10-CM

## 2017-07-29 DIAGNOSIS — N760 Acute vaginitis: Secondary | ICD-10-CM | POA: Insufficient documentation

## 2017-07-29 DIAGNOSIS — Z79899 Other long term (current) drug therapy: Secondary | ICD-10-CM | POA: Insufficient documentation

## 2017-07-29 DIAGNOSIS — N898 Other specified noninflammatory disorders of vagina: Secondary | ICD-10-CM | POA: Diagnosis present

## 2017-07-29 LAB — URINALYSIS, ROUTINE W REFLEX MICROSCOPIC
Bilirubin Urine: NEGATIVE
GLUCOSE, UA: NEGATIVE mg/dL
Hgb urine dipstick: NEGATIVE
Ketones, ur: NEGATIVE mg/dL
LEUKOCYTES UA: NEGATIVE
Nitrite: NEGATIVE
PROTEIN: NEGATIVE mg/dL
SPECIFIC GRAVITY, URINE: 1.018 (ref 1.005–1.030)
pH: 6 (ref 5.0–8.0)

## 2017-07-29 LAB — WET PREP, GENITAL
Sperm: NONE SEEN
TRICH WET PREP: NONE SEEN
Yeast Wet Prep HPF POC: NONE SEEN

## 2017-07-29 LAB — PREGNANCY, URINE: PREG TEST UR: NEGATIVE

## 2017-07-29 MED ORDER — METRONIDAZOLE 500 MG PO TABS: 500.0000 mg | ORAL_TABLET | Freq: Two times a day (BID) | ORAL | 0 refills | Status: DC

## 2017-07-29 NOTE — ED Provider Notes (Signed)
[facility]Y [facility]PT [facility] Provider Note   CSN: [id number] Arrival date & time: 07/29/17  0845     History   Chief Complaint Chief Complaint  Patient presents with  . Vaginal Discharge    HPI Caroline Weber is a 31 y.o. female.  HPI   31 year old female, G2 P0 presenting complaining of vaginal discharge. Patient states for the past week she has noticed vaginal discharge with mild odor, and mild vaginal irritation. Symptom has been persistent. She denies any associated fever, chills, abdominal pain, back pain, dysuria, hematuria, vaginal bleeding, or rash. Remote history of gonorrhea. Denies any new sexual partner. She does use protection. Her last menstrual period is 08/05. She denies any change in body soap detergent and does not douche.  History reviewed. No pertinent past medical history.  Patient Active Problem List   Diagnosis Date Noted  . Nonviable pregnancy 05/27/2017    History reviewed. No pertinent surgical history.  OB History    No data available       Home Medications    Prior to Admission medications   Medication Sig Start Date End Date Taking? Authorizing Provider  HYDROcodone-acetaminophen (NORCO/VICODIN) 5-325 MG tablet Take 1-2 tablets every 6 hours as needed for severe pain 01/12/17   Renne Crigler, PA-C  metroNIDAZOLE (FLAGYL) 500 MG tablet Take 1 tablet (500 mg total) by mouth 2 (two) times daily. 04/22/17   Russo, [place] N, PA-C  naproxen (NAPROSYN) 500 MG tablet Take 1 tablet (500 mg total) by mouth 2 (two) times daily. 01/12/17   Renne Crigler, PA-C    Family History No family history on file.  Social History Social History  Substance Use Topics  . Smoking status: Never Smoker  . Smokeless tobacco: Never Used  . Alcohol use No     Comment: occ     Allergies   Patient has no known allergies.   Review of Systems Review of Systems  All other systems reviewed and are negative.    Physical Exam Updated Vital Signs Ht 5\' 3"  (1.6  m)   Wt 77.1 kg (170 lb)   LMP 07/10/2017 (Exact Date)   BMI 30.11 kg/m   Physical Exam  Constitutional: She appears well-developed and well-nourished. No distress.  HENT:  Head: Atraumatic.  Eyes: Conjunctivae are normal.  Neck: Neck supple.  Cardiovascular: Normal rate and regular rhythm.   Abdominal: Soft. She exhibits no distension. There is no tenderness.  Genitourinary:  Genitourinary Comments: Pelvic exam: RN in room as chaperone, external female genitalia normal with no signs of lesions or injuries. Speculum exam shows normal cervix with no obvious discharge. Bimanual exam with no adnexal tenderness, no cervical motion tenderness, uterus normal size and nontender, no masses appreciated. The external cervical os is closed.   Neurological: She is alert.  Skin: No rash noted.  Psychiatric: She has a normal mood and affect.  Nursing note and vitals reviewed.    ED Treatments / Results  Labs (all labs ordered are listed, but only abnormal results are displayed) Labs Reviewed  WET PREP, GENITAL - Abnormal; Notable for the following:       Result Value   Clue Cells Wet Prep HPF POC PRESENT (*)    WBC, Wet Prep HPF POC MANY (*)    All other components within normal limits  URINALYSIS, ROUTINE W REFLEX MICROSCOPIC - Abnormal; Notable for the following:    APPearance CLOUDY (*)    All other components within normal limits  PREGNANCY, URINE  RPR  HIV ANTIBODY (ROUTINE TESTING)  GC/CHLAMYDIA PROBE AMP ([place]) NOT AT [facility]    EKG  EKG Interpretation None       Radiology No results found.  Procedures Procedures (including critical care time)  Medications Ordered in ED Medications - No data to display   Initial Impression / Assessment and Plan / ED Course  I have reviewed the triage vital signs and the nursing notes.  Pertinent labs & imaging results that were available during my care of the patient were reviewed by me and considered in my medical decision  making (see chart for details).     BP 115/73 (BP Location: Right Arm)   Pulse 66   Temp 98.1 F (36.7 C) (Oral)   Resp 14   Ht 5\' 3"  (1.6 m)   Wt 77.1 kg (170 lb)   LMP 07/10/2017 (Exact Date)   SpO2 100%   BMI 30.11 kg/m    Final Clinical Impressions(s) / ED Diagnoses   Final diagnoses:  BV (bacterial vaginosis)    New Prescriptions New Prescriptions   No medications on file   9:13 AM Patient here for vaginal discharge. Patient was concerned for bacterial vaginosis. She does not have any significant discomfort on pelvic exam concerning for PID. She does have evidence of BV on wet prep. She will be treated for that. Patient will be notified if she tested positive for other infection through cultures.   Fayrene Helper, PA-C 07/29/17 7425    Tilden Fossa, MD 07/30/17 [phone]

## 2017-07-29 NOTE — ED Notes (Signed)
ED Provider at bedside. 

## 2017-07-29 NOTE — ED Triage Notes (Signed)
"   I am having a discharge for a few days" Denies pain or odor

## 2017-07-29 NOTE — Discharge Instructions (Signed)
Take flagyl as treatment of bacterial vaginosis.  Avoid alcohol when taking antibiotic as it will make you sick.

## 2017-07-30 LAB — RPR: RPR Ser Ql: NONREACTIVE

## 2017-07-30 LAB — HIV ANTIBODY (ROUTINE TESTING W REFLEX): HIV SCREEN 4TH GENERATION: NONREACTIVE

## 2017-08-01 LAB — GC/CHLAMYDIA PROBE AMP (~~LOC~~) NOT AT ARMC
CHLAMYDIA, DNA PROBE: NEGATIVE
NEISSERIA GONORRHEA: NEGATIVE

## 2017-08-06 ENCOUNTER — Emergency Department (HOSPITAL_BASED_OUTPATIENT_CLINIC_OR_DEPARTMENT_OTHER)
Admission: EM | Admit: 2017-08-06 | Discharge: 2017-08-06 | Disposition: A | Discharge status: Home / Self Care | Payer: BLUE CROSS/BLUE SHIELD | Attending: Emergency Medicine | Admitting: Emergency Medicine

## 2017-08-06 ENCOUNTER — Encounter (HOSPITAL_BASED_OUTPATIENT_CLINIC_OR_DEPARTMENT_OTHER): Payer: Self-pay | Admitting: Emergency Medicine

## 2017-08-06 DIAGNOSIS — M545 Low back pain, unspecified: Secondary | ICD-10-CM

## 2017-08-06 DIAGNOSIS — K625 Hemorrhage of anus and rectum: Secondary | ICD-10-CM | POA: Diagnosis not present

## 2017-08-06 DIAGNOSIS — Z79899 Other long term (current) drug therapy: Secondary | ICD-10-CM | POA: Insufficient documentation

## 2017-08-06 LAB — URINALYSIS, ROUTINE W REFLEX MICROSCOPIC
Bilirubin Urine: NEGATIVE
Glucose, UA: NEGATIVE mg/dL
Hgb urine dipstick: NEGATIVE
Ketones, ur: NEGATIVE mg/dL
NITRITE: NEGATIVE
PROTEIN: NEGATIVE mg/dL
Specific Gravity, Urine: 1.025 (ref 1.005–1.030)
pH: 5.5 (ref 5.0–8.0)

## 2017-08-06 LAB — CBC WITH DIFFERENTIAL/PLATELET
BASOS ABS: 0 10*3/uL (ref 0.0–0.1)
Basophils Relative: 0 %
EOS ABS: 0.1 10*3/uL (ref 0.0–0.7)
EOS PCT: 1 %
HCT: 39 % (ref 36.0–46.0)
Hemoglobin: 13.1 g/dL (ref 12.0–15.0)
LYMPHS PCT: 28 %
Lymphs Abs: 3.4 10*3/uL (ref 0.7–4.0)
MCH: 29.7 pg (ref 26.0–34.0)
MCHC: 33.6 g/dL (ref 30.0–36.0)
MCV: 88.4 fL (ref 78.0–100.0)
Monocytes Absolute: 1 10*3/uL (ref 0.1–1.0)
Monocytes Relative: 8 %
Neutro Abs: 7.7 10*3/uL (ref 1.7–7.7)
Neutrophils Relative %: 63 %
PLATELETS: 288 10*3/uL (ref 150–400)
RBC: 4.41 MIL/uL (ref 3.87–5.11)
RDW: 12.8 % (ref 11.5–15.5)
WBC: 12.2 10*3/uL — AB (ref 4.0–10.5)

## 2017-08-06 LAB — BASIC METABOLIC PANEL
ANION GAP: 6 (ref 5–15)
BUN: 8 mg/dL (ref 6–20)
CO2: 26 mmol/L (ref 22–32)
Calcium: 9.6 mg/dL (ref 8.9–10.3)
Chloride: 104 mmol/L (ref 101–111)
Creatinine, Ser: 0.77 mg/dL (ref 0.44–1.00)
GFR calc Af Amer: >60 mL/min (ref >60)
Glucose, Bld: 100 mg/dL — ABNORMAL HIGH (ref 65–99)
POTASSIUM: 4.1 mmol/L (ref 3.5–5.1)
SODIUM: 136 mmol/L (ref 135–145)

## 2017-08-06 LAB — OCCULT BLOOD X 1 CARD TO LAB, STOOL: FECAL OCCULT BLD: NEGATIVE

## 2017-08-06 LAB — PREGNANCY, URINE: PREG TEST UR: NEGATIVE

## 2017-08-06 LAB — URINALYSIS, MICROSCOPIC (REFLEX): RBC / HPF: NONE SEEN RBC/hpf (ref 0–5)

## 2017-08-06 MED ORDER — IBUPROFEN 400 MG PO TABS
600.0000 mg | ORAL_TABLET | Freq: Once | ORAL | Status: AC
  Administered 2017-08-06: 600 mg via ORAL
  Filled 2017-08-06: qty 1

## 2017-08-06 MED ORDER — IBUPROFEN 600 MG PO TABS: 600.0000 mg | ORAL_TABLET | Freq: Four times a day (QID) | ORAL | 0 refills | Status: DC | PRN

## 2017-08-06 MED ORDER — POLYETHYLENE GLYCOL 3350 17 GM/SCOOP PO POWD: 17.0000 g | Freq: Every day | ORAL | 0 refills | Status: DC

## 2017-08-06 NOTE — ED Notes (Signed)
Pt denies taking medication at home. Denies urinary symptoms, nausea or vomiting.

## 2017-08-06 NOTE — Discharge Instructions (Signed)
Your back pain is likely muscle strain from your lifting and repetitive activity at work. Take ibuprofen, rest, and heat packs  You have no signs of serious rectal bleeding. This is likely due to hemorrhoids internally. Take stool softeners, like Miralax, and see if softer stools will help your symptoms.  Return for worsening symptoms, including fever, severe abdominal pain, escalating back pain, inability to walk, worsening bleeding or any other symptoms concerning to you.

## 2017-08-06 NOTE — ED Provider Notes (Signed)
[facility] Provider Note   CSN: [id number] Arrival date & time: 08/06/17  1151     History   Chief Complaint Chief Complaint  Patient presents with  . Shoulder Pain    HPI Caroline Weber is a 31 y.o. female.  HPI 31 year old female who presents with bilateral shoulder playing, low back pain, and rectal bleeding. She has no significant past medical history. For several weeks now has noticed intermittent low back pain and bilateral shoulder pain. Pain is worse with movement. No fall or trauma. 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 This morning noticed that she had bright red blood per rectum, that stain the toilet bowl and had streaks of blood in her stool. States that her stool were in small pieces and yesterday it was hard in nature. Denies any straining or significant constipation but normally has bowel movement every other day. No fevers, chills, nausea or vomiting, abdominal distention or abdominal pain, dysuria or urinary frequency. No focal numbness or weakness, bowel or urinary incontinence, urinary retention.   History reviewed. No pertinent past medical history.  Patient Active Problem List   Diagnosis Date Noted  . Nonviable pregnancy 05/27/2017    History reviewed. No pertinent surgical history.  OB History    No data available       Home Medications    Prior to Admission medications   Medication Sig Start Date End Date Taking? Authorizing Provider  HYDROcodone-acetaminophen (NORCO/VICODIN) 5-325 MG tablet Take 1-2 tablets every 6 hours as needed for severe pain 01/12/17   Renne Crigler, PA-C  ibuprofen (ADVIL,MOTRIN) 600 MG tablet Take 1 tablet (600 mg total) by mouth every 6 (six) hours as needed. 08/06/17   Lavera Guise, MD  metroNIDAZOLE (FLAGYL) 500 MG tablet Take 1 tablet (500 mg total) by mouth 2 (two) times daily. 07/29/17   Fayrene Helper, PA-C  naproxen (NAPROSYN) 500 MG tablet Take 1 tablet (500 mg total)  by mouth 2 (two) times daily. 01/12/17   Renne Crigler, PA-C  polyethylene glycol powder (GLYCOLAX/MIRALAX) powder Take 17 g by mouth daily. 08/06/17   Lavera Guise, MD    Family History History reviewed. No pertinent family history.  Social History Social History  Substance Use Topics  . Smoking status: Never Smoker  . Smokeless tobacco: Never Used  . Alcohol use No     Comment: occ     Allergies   Patient has no known allergies.   Review of Systems Review of Systems  Constitutional: Negative for fever.  Cardiovascular: Negative for chest pain.  Gastrointestinal: Positive for blood in stool. Negative for abdominal pain, nausea and vomiting.  Musculoskeletal: Positive for back pain.  All other systems reviewed and are negative.    Physical Exam Updated Vital Signs BP 127/85   Pulse 75   Temp 98.4 F (36.9 C) (Oral)   Resp 20   Wt 76 kg (167 lb 8 oz)   LMP 07/10/2017 (Exact Date)   SpO2 100%   BMI 29.67 kg/m   Physical Exam Physical Exam  Nursing note and vitals reviewed. Constitutional: Well developed, well nourished, non-toxic, and in no acute distress Head: Normocephalic and atraumatic.  Mouth/Throat: Oropharynx is clear and moist.  Neck: Normal range of motion. Neck supple.  Cardiovascular: Normal rate and regular rhythm.   Pulmonary/Chest: Effort normal and breath sounds normal.  Abdominal: Soft. There is no tenderness. There is no rebound and no guarding. no stool or blood on rectal  exam. No hemorrhoids or fissures externally Musculoskeletal: Normal range of motion of all extremities. Tenderness to palpation over left parapsinal muscles of low back.  Neurological: Alert, no facial droop, fluent speech, moves all extremities symmetrically, normal patellar reflexes bilaterally, sensation to light touch intact in lower extremities, full strength in lower extremities Skin: Skin is warm and dry.  Psychiatric: Cooperative   ED Treatments / Results  Labs (all  labs ordered are listed, but only abnormal results are displayed) Labs Reviewed  CBC WITH DIFFERENTIAL/PLATELET - Abnormal; Notable for the following:       Result Value   WBC 12.2 (*)    All other components within normal limits  BASIC METABOLIC PANEL - Abnormal; Notable for the following:    Glucose, Bld 100 (*)    All other components within normal limits  URINALYSIS, ROUTINE W REFLEX MICROSCOPIC - Abnormal; Notable for the following:    Leukocytes, UA SMALL (*)    All other components within normal limits  URINALYSIS, MICROSCOPIC (REFLEX) - Abnormal; Notable for the following:    Bacteria, UA RARE (*)    Squamous Epithelial / LPF 6-30 (*)    All other components within normal limits  OCCULT BLOOD X 1 CARD TO LAB, STOOL  PREGNANCY, URINE    EKG  EKG Interpretation None       Radiology No results found.  Procedures Procedures (including critical care time)  Medications Ordered in ED Medications  ibuprofen (ADVIL,MOTRIN) tablet 600 mg (600 mg Oral Given 08/06/17 1349)     Initial Impression / Assessment and Plan / ED Course  I have reviewed the triage vital signs and the nursing notes.  Pertinent labs & imaging results that were available during my care of the patient were reviewed by me and considered in my medical decision making (see chart for details).     31 year old female who presents With bright red blood per rectum today, and several weeks of intermittent back pain. She is well-appearing in no acute distress. Vital signs non-concerning. 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 She has a normal neurological exam and no concerning features to suspect acute spinal cord process or spine process. Not pregnant and has no evidence of UTI. Do not suspect intra-abdominal process or retroperitoneal process.  She has no evidence of active bleeding on exam today and is a stable  hemoglobin. Prior blood per rectum likely related to internal hemorrhoids that she has had history of constipation. We'll start stool softeners.  Final Clinical Impressions(s) / ED Diagnoses   Final diagnoses:  Acute midline low back pain without sciatica  Bright red rectal bleeding    New Prescriptions New Prescriptions   IBUPROFEN (ADVIL,MOTRIN) 600 MG TABLET    Take 1 tablet (600 mg total) by mouth every 6 (six) hours as needed.   POLYETHYLENE GLYCOL POWDER (GLYCOLAX/MIRALAX) POWDER    Take 17 g by mouth daily.     Lavera GuiseLiu, Hughey Rittenberry Duo, MD 08/06/17 [phone]

## 2017-08-06 NOTE — ED Triage Notes (Signed)
Patient states that she has had pain to her bilateral shoulders and her neck to her mid back x 1 week "for a while" - the patient states that she started to have blood in her stool today. Patient denies any feelings of Dizziness or Lightheadedness. The patient in no acute distress

## 2018-01-06 DIAGNOSIS — O039 Complete or unspecified spontaneous abortion without complication: Secondary | ICD-10-CM

## 2018-01-06 HISTORY — DX: Complete or unspecified spontaneous abortion without complication: O03.9

## 2018-01-09 ENCOUNTER — Ambulatory Visit: Payer: Medicaid Other

## 2018-01-09 ENCOUNTER — Encounter: Payer: Self-pay | Admitting: Obstetrics and Gynecology

## 2018-01-09 ENCOUNTER — Ambulatory Visit (HOSPITAL_COMMUNITY)
Admission: RE | Admit: 2018-01-09 | Discharge: 2018-01-09 | Disposition: A | Discharge status: Home / Self Care | Payer: Medicaid Other | Source: Ambulatory Visit | Attending: Obstetrics and Gynecology | Admitting: Obstetrics and Gynecology

## 2018-01-09 ENCOUNTER — Ambulatory Visit (INDEPENDENT_AMBULATORY_CARE_PROVIDER_SITE_OTHER): Payer: Medicaid Other | Admitting: Obstetrics and Gynecology

## 2018-01-09 VITALS — BP 114/70 | HR 74 | Wt 164.0 lb

## 2018-01-09 DIAGNOSIS — Z3490 Encounter for supervision of normal pregnancy, unspecified, unspecified trimester: Secondary | ICD-10-CM

## 2018-01-09 DIAGNOSIS — O208 Other hemorrhage in early pregnancy: Secondary | ICD-10-CM | POA: Diagnosis not present

## 2018-01-09 DIAGNOSIS — Z3A08 8 weeks gestation of pregnancy: Secondary | ICD-10-CM | POA: Diagnosis not present

## 2018-01-09 DIAGNOSIS — O021 Missed abortion: Secondary | ICD-10-CM | POA: Insufficient documentation

## 2018-01-09 MED ORDER — MISOPROSTOL 200 MCG PO TABS
ORAL_TABLET | ORAL | 1 refills | Status: DC
Start: 2018-01-09 — End: 2018-01-15

## 2018-01-09 MED ORDER — ACETAMINOPHEN-CODEINE #3 300-30 MG PO TABS: 1.0000 | ORAL_TABLET | ORAL | 0 refills | Status: DC | PRN

## 2018-01-09 MED ORDER — PROMETHAZINE HCL 25 MG PO TABS: 25.0000 mg | ORAL_TABLET | Freq: Four times a day (QID) | ORAL | 0 refills | Status: DC | PRN

## 2018-01-09 MED ORDER — IBUPROFEN 600 MG PO TABS: 600.0000 mg | ORAL_TABLET | Freq: Four times a day (QID) | ORAL | 0 refills | Status: DC | PRN

## 2018-01-09 NOTE — Progress Notes (Signed)
Obstetrics and Gynecology Established Patient Evaluation  Appointment Date: 01/09/2018  OBGYN Clinic: [facility]  Primary Care Provider: Iona HansenJones, Penny L  Referring Provider: self  Chief Complaint: 2nd opinion re: pregnancy viability  History of Present Illness: Caroline Weber is a 32 y.o. African-American G3P0020 (11/20), seen for the above chief complaint. Her past medical history is significant for h/o two SABs in the past  [facility] and had u/s that showed cardiac activity in the 80s for the pregnancy. F/u 1/16 u/s showed no activity. Pt wanted exp management and she states she didn't go to her f/u visit with them b/c of her visit her. No VB, pain, fevers, chills  Review of Systems:  as noted in the History of Present Illness.  Past Medical History:  History reviewed. No pertinent past medical history.  Past Surgical History:  History reviewed. No pertinent surgical history.  Past Obstetrical History:  OB History  Gravida Para Term Preterm AB Living  3       2    SAB TAB Ectopic Multiple Live Births  2            # Outcome Date GA Lbr Len/2nd Weight Sex Delivery Anes PTL Lv  3 Gravida           2 SAB           1 SAB             Obstetric Comments  1) missed AB. 2) early SAB mid 2018    Past Gynecological History: As per HPI. Periods: qmonth, regular History of Pap Smear(s): Yes.   Last pap 2017, which was NILM She is currently using nothing for contraception.   Social History:  Social History   Socioeconomic History  . Marital status: Divorced    Spouse name: Not on file  . Number of children: Not on file  . Years of education: Not on file  . Highest education level: Not on file  Social Needs  . Financial resource strain: Not on file  . Food insecurity - worry: Not on file  . Food insecurity - inability: Not on file  . Transportation needs - medical: Not on file  . Transportation needs - non-medical: Not  on file  Occupational History  . Not on file  Tobacco Use  . Smoking status: Never Smoker  . Smokeless tobacco: Never Used  Substance and Sexual Activity  . Alcohol use: No    Comment: occ  . Drug use: No  . Sexual activity: Yes    Birth control/protection: None  Other Topics Concern  . Not on file  Social History Narrative  . Not on file    Family History: History reviewed. No pertinent family history.   Medications None and she states she wasn't on any meds when she found out she was pregnant  Allergies Patient has no known allergies.   Physical Exam:  There were no vitals taken for this visit. There is no height or weight on file to calculate BMI. 1110 General appearance: Well nourished, well developed female in no acute distress.   Laboratory: O POS  Radiology: as per HPI  Assessment: pt stable  Plan:  1. Early stage of pregnancy D/w her that prior u/s findings aren't reassuring and unlikely to be different on 2nd scan but it is no problem to re do u/s. Will try and do it for today and will call pt re: results -  US OB LESS THAN 14 WEEKS WITH OB TRANSVAGINAL; Future  RTC PRN. Based on u/s results.   Cornelia Copa MD Attending Center for [facility])   Update U/s reviewed with patient and d/w her re: options and she elects medical management in about a week. Cytotec, phenergan, motrin and tylenol #3 given and instructions on how to use and ER precautions. Will see back in about two weeks for an u/s and f/u visit and can d/w her more re: recurrent SAB work up.  Cornelia Copa MD Attending Center for [employer] (Faculty Practice) 01/10/2018 Time: [phone]

## 2018-01-15 ENCOUNTER — Inpatient Hospital Stay (HOSPITAL_COMMUNITY)
Admission: AD | Admit: 2018-01-15 | Discharge: 2018-01-15 | Disposition: A | Discharge status: Home / Self Care | Payer: Medicaid Other | Source: Ambulatory Visit | Attending: Obstetrics and Gynecology | Admitting: Obstetrics and Gynecology

## 2018-01-15 ENCOUNTER — Other Ambulatory Visit: Payer: Self-pay

## 2018-01-15 ENCOUNTER — Encounter: Payer: Self-pay | Admitting: Student

## 2018-01-15 ENCOUNTER — Inpatient Hospital Stay (HOSPITAL_COMMUNITY): Payer: Medicaid Other

## 2018-01-15 DIAGNOSIS — Z3A01 Less than 8 weeks gestation of pregnancy: Secondary | ICD-10-CM | POA: Diagnosis not present

## 2018-01-15 DIAGNOSIS — O039 Complete or unspecified spontaneous abortion without complication: Secondary | ICD-10-CM

## 2018-01-15 DIAGNOSIS — R42 Dizziness and giddiness: Secondary | ICD-10-CM | POA: Diagnosis not present

## 2018-01-15 DIAGNOSIS — O4691 Antepartum hemorrhage, unspecified, first trimester: Secondary | ICD-10-CM | POA: Insufficient documentation

## 2018-01-15 DIAGNOSIS — Z79899 Other long term (current) drug therapy: Secondary | ICD-10-CM | POA: Insufficient documentation

## 2018-01-15 DIAGNOSIS — O034 Incomplete spontaneous abortion without complication: Secondary | ICD-10-CM | POA: Insufficient documentation

## 2018-01-15 DIAGNOSIS — O4690 Antepartum hemorrhage, unspecified, unspecified trimester: Secondary | ICD-10-CM | POA: Diagnosis present

## 2018-01-15 DIAGNOSIS — O209 Hemorrhage in early pregnancy, unspecified: Secondary | ICD-10-CM

## 2018-01-15 HISTORY — DX: Other specified health status: Z78.9

## 2018-01-15 LAB — CBC
HCT: 33.3 % — ABNORMAL LOW (ref 36.0–46.0)
HEMOGLOBIN: 11.6 g/dL — AB (ref 12.0–15.0)
MCH: 29.9 pg (ref 26.0–34.0)
MCHC: 34.8 g/dL (ref 30.0–36.0)
MCV: 85.8 fL (ref 78.0–100.0)
PLATELETS: 310 10*3/uL (ref 150–400)
RBC: 3.88 MIL/uL (ref 3.87–5.11)
RDW: 12.5 % (ref 11.5–15.5)
WBC: 12.9 10*3/uL — AB (ref 4.0–10.5)

## 2018-01-15 LAB — TYPE AND SCREEN
ABO/RH(D): O POS
ANTIBODY SCREEN: NEGATIVE

## 2018-01-15 LAB — ABO/RH: ABO/RH(D): O POS

## 2018-01-15 MED ORDER — ACETAMINOPHEN-CODEINE #3 300-30 MG PO TABS: 1.0000 | ORAL_TABLET | ORAL | 0 refills | Status: DC | PRN

## 2018-01-15 MED ORDER — MISOPROSTOL 200 MCG PO TABS
800.0000 ug | ORAL_TABLET | Freq: Once | ORAL | Status: AC
  Administered 2018-01-15: 800 ug via BUCCAL
  Filled 2018-01-15: qty 4

## 2018-01-15 MED ORDER — LACTATED RINGERS IV BOLUS (SEPSIS)
1000.0000 mL | Freq: Once | INTRAVENOUS | Status: AC
  Administered 2018-01-15: 1000 mL via INTRAVENOUS

## 2018-01-15 NOTE — MAU Provider Note (Signed)
History     CSN: [id number]  Arrival date and time: 01/15/18 4098   First Provider Initiated Contact with Patient 01/15/18 0455      Chief Complaint  Patient presents with  . Vaginal Bleeding   HPI Caroline Weber is a 32 y.o. G3P0020 at [redacted]w[redacted]d who presents via EMS for abdominal pain and vaginal bleeding. Patient has been seen by 2 OBs in the last month and has been diagnosed with missed Ab. Was going to take cytotec this weekend but had a bid bleed earlier today and didn't think she needed to take it. States she had heavy bleeding & thinks she passed the pregnancy this afternoon. This evening she was still having pain so took tylenol #3 at 11 pm. Woke up this morning around 3 am & states she felt dizzy and had ringing in her ears whenever she stands up so was worried.   OB History    Gravida Para Term Preterm AB Living   3       2     SAB TAB Ectopic Multiple Live Births   2              Obstetric Comments   1) blighted ovum 2) first trimester SAB of twins      Past Medical History:  Diagnosis Date  . Medical history non-contributory     Past Surgical History:  Procedure Laterality Date  . NO PAST SURGERIES      History reviewed. No pertinent family history.  Social History   Tobacco Use  . Smoking status: Never Smoker  . Smokeless tobacco: Never Used  Substance Use Topics  . Alcohol use: No    Comment: occ  . Drug use: No    Allergies: No Known Allergies  Medications Prior to Admission  Medication Sig Dispense Refill Last Dose  . acetaminophen-codeine (TYLENOL #3) 300-30 MG tablet Take 1-2 tablets by mouth every 4 (four) hours as needed for moderate pain or severe pain. 6 tablet 0 01/14/2018 at 2300  . HYDROcodone-acetaminophen (NORCO/VICODIN) 5-325 MG tablet Take 1-2 tablets every 6 hours as needed for severe pain 8 tablet 0   . ibuprofen (ADVIL,MOTRIN) 600 MG tablet Take 1 tablet (600 mg total) by mouth every 6 (six) hours as needed. 60 tablet 0   .  ibuprofen (ADVIL,MOTRIN) 600 MG tablet Take 1 tablet (600 mg total) by mouth every 6 (six) hours as needed. 30 tablet 0   . metroNIDAZOLE (FLAGYL) 500 MG tablet Take 1 tablet (500 mg total) by mouth 2 (two) times daily. 14 tablet 0   . misoprostol (CYTOTEC) 200 MCG tablet Place four tablets in vaginally as far as you can 4 tablet 1   . naproxen (NAPROSYN) 500 MG tablet Take 1 tablet (500 mg total) by mouth 2 (two) times daily. 30 tablet 0   . polyethylene glycol powder (GLYCOLAX/MIRALAX) powder Take 17 g by mouth daily. 500 g 0   . promethazine (PHENERGAN) 25 MG tablet Take 1 tablet (25 mg total) by mouth every 6 (six) hours as needed for nausea or vomiting. 15 tablet 0     Review of Systems  Constitutional: Negative.   HENT: Positive for tinnitus.   Gastrointestinal: Positive for abdominal pain.  Genitourinary: Positive for vaginal bleeding.  Neurological: Positive for dizziness.   Physical Exam   Blood pressure 120/75, pulse 79, temperature 98 F (36.7 C), resp. rate 18, height 5\' 3"  (1.6 m), weight 164 lb (74.4 kg), last menstrual period 07/10/2017. Orthostatic  VS for the past 24 hrs:  BP- Lying Pulse- Lying BP- Sitting Pulse- Sitting BP- Standing at 0 minutes Pulse- Standing at 0 minutes  01/15/18 0520 109/73 89 106/72 101 92/67 123     Physical Exam  Nursing note and vitals reviewed. Constitutional: She is oriented to person, place, and time. She appears well-developed and well-nourished. No distress.  HENT:  Head: Normocephalic and atraumatic.  Eyes: Conjunctivae are normal. Right eye exhibits no discharge. Left eye exhibits no discharge. No scleral icterus.  Neck: Normal range of motion.  Cardiovascular: Normal rate, regular rhythm and normal heart sounds.  No murmur heard. Respiratory: Effort normal and breath sounds normal. No respiratory distress. She has no wheezes.  GI: Soft.  Genitourinary: There is bleeding in the vagina.  Genitourinary Comments: 3-4 cm piece of  gray tissue pulled from cervical os with ring forceps. Small to moderate dark red blood slowed down after removing tissue.  Neurological: She is alert and oriented to person, place, and time.  Skin: Skin is warm and dry. She is not diaphoretic.  Psychiatric: She has a normal mood and affect. Her behavior is normal. Judgment and thought content normal.    MAU Course  Procedures Results for orders placed or performed during the hospital encounter of 01/15/18 (from the past 24 hour(s))  CBC     Status: Abnormal   Collection Time: 01/15/18  5:30 AM  Result Value Ref Range   WBC 12.9 (H) 4.0 - 10.5 K/uL   RBC 3.88 3.87 - 5.11 MIL/uL   Hemoglobin 11.6 (L) 12.0 - 15.0 g/dL   HCT 69.6 (L) 29.5 - 28.4 %   MCV 85.8 78.0 - 100.0 fL   MCH 29.9 26.0 - 34.0 pg   MCHC 34.8 30.0 - 36.0 g/dL   RDW 13.2 44.0 - 10.2 %   Platelets 310 150 - 400 K/uL  Type and screen     Status: None   Collection Time: 01/15/18  5:30 AM  Result Value Ref Range   ABO/RH(D) O POS    Antibody Screen NEG    Sample Expiration      01/18/2018 Performed at [facility], [address], [place], [place] [address]    US Ob Transvaginal  Result Date: 01/15/2018 CLINICAL DATA:  Acute onset of vaginal bleeding and pelvic pain. EXAM: TRANSVAGINAL OB ULTRASOUND TECHNIQUE: Transvaginal ultrasound was performed for complete evaluation of the gestation as well as the maternal uterus, adnexal regions, and pelvic cul-de-sac. COMPARISON:  Pelvic ultrasound performed 12/21/2017 FINDINGS: Intrauterine gestational sac: None seen. Yolk sac:  N/A Embryo:  N/A Subchorionic hemorrhage:  N/A Maternal uterus/adnexae: There is a large amount of blood noted within the cervix and vaginal canal. The endometrial echo complex is diffusely thickened, with significantly increased vascularity noted anteriorly, concerning for retained products of conception. The ovaries are unremarkable in appearance. The right ovary measures 3.9 x 1.8 x 2.1 cm, while the  left ovary measures 3.9 x 2.3 x 2.2 cm. No suspicious adnexal masses are seen; there is no evidence for ovarian torsion. No free fluid is seen within the pelvic cul-de-sac. IMPRESSION: Status post spontaneous abortion. Large amount of blood noted within the cervix and vaginal canal. Significantly increased vascularity noted along the anterior aspect of the endometrial echo complex, concerning for retained products of conception. These results were called by telephone at the time of interpretation on 01/15/2018 at 6:17 am to Judeth Horn NP, who verbally acknowledged these results. Electronically Signed   By: Roanna Raider  M.D.   On: 01/15/2018 06:18    MDM Patient orthostatic. CBC pending. IV fluid bolus ordered.  Hemoglobin 11.6 O positive Ultrasound shows RPOC. Cytotec 800 mcg given. Patient declines pain medication at this time. Will monitor bleeding and reassess  Care turned over to [facility] Left[facility] Kirby CNM  Judeth HornLawrence, Erin, NP 01/15/2018 01/15/2018 8:23 AM   Assessment and Plan    MDM: Pt feels better after IV fluids. After Cytotec dose, pt had increased bleeding with small clots but denied dizziness or ringing of her ears when standing/walking to bathroom. Monitored bleeding x 1-2 hours and pt bleeding stable and she is without symptoms. Bleeding precautions reviewed/reasons to return to MAU.  Rx renewed for Tylenol # 3 for 6 more tabs.  Outpatient US cancelled but [facility]/06/[facility] at 11:00 am.   A: 1. Incomplete miscarriage   2. Miscarriage   3. Vaginal bleeding in pregnancy, first trimester     P: D/C home with bleeding precautions  Sharen CounterLisa Leftwich-Kirby, CNM 10:05 AM

## 2018-01-15 NOTE — MAU Note (Addendum)
Was told in Clinic 01/09/18 that no fetal heart rate noted. Started bleeding Sat about 1600. Has passed several clots noted by pics on pt's phone. On arrival small amt blood on towels pt wore in. States gets lightheaded when up and ringing in ears when up. Some lower back pain. [facility]

## 2018-01-15 NOTE — MAU Note (Signed)
Patient up to stand beside the bed for full minute, no c/o dizziness stating she feels okay.

## 2018-02-06 ENCOUNTER — Ambulatory Visit: Payer: Medicaid Other | Admitting: Obstetrics and Gynecology

## 2018-02-06 ENCOUNTER — Ambulatory Visit (HOSPITAL_COMMUNITY): Payer: Medicaid Other

## 2018-02-21 ENCOUNTER — Ambulatory Visit: Payer: Medicaid Other | Admitting: Family Medicine

## 2018-04-04 ENCOUNTER — Ambulatory Visit (HOSPITAL_COMMUNITY)
Admission: EM | Admit: 2018-04-04 | Discharge: 2018-04-04 | Disposition: A | Discharge status: Home / Self Care | Attending: Family Medicine | Admitting: Family Medicine

## 2018-04-04 ENCOUNTER — Encounter (HOSPITAL_COMMUNITY): Payer: Self-pay | Admitting: Emergency Medicine

## 2018-04-04 DIAGNOSIS — Z041 Encounter for examination and observation following transport accident: Secondary | ICD-10-CM

## 2018-04-04 DIAGNOSIS — Z3202 Encounter for pregnancy test, result negative: Secondary | ICD-10-CM

## 2018-04-04 DIAGNOSIS — M546 Pain in thoracic spine: Secondary | ICD-10-CM

## 2018-04-04 LAB — POCT PREGNANCY, URINE: PREG TEST UR: NEGATIVE

## 2018-04-04 MED ORDER — CYCLOBENZAPRINE HCL 5 MG PO TABS: 5.0000 mg | ORAL_TABLET | Freq: Every evening | ORAL | 0 refills | Status: DC | PRN

## 2018-04-04 MED ORDER — MELOXICAM 7.5 MG PO TABS: 7.5000 mg | ORAL_TABLET | Freq: Every day | ORAL | 0 refills | Status: DC
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
## 2018-04-27 ENCOUNTER — Emergency Department (HOSPITAL_COMMUNITY)
Admission: EM | Admit: 2018-04-27 | Discharge: 2018-04-27 | Disposition: A | Discharge status: Home / Self Care | Payer: Medicaid Other | Attending: Emergency Medicine | Admitting: Emergency Medicine

## 2018-04-27 ENCOUNTER — Encounter (HOSPITAL_COMMUNITY): Payer: Self-pay

## 2018-04-27 ENCOUNTER — Other Ambulatory Visit: Payer: Self-pay

## 2018-04-27 DIAGNOSIS — Z3200 Encounter for pregnancy test, result unknown: Secondary | ICD-10-CM | POA: Insufficient documentation

## 2018-04-27 DIAGNOSIS — Z5321 Procedure and treatment not carried out due to patient leaving prior to being seen by health care provider: Secondary | ICD-10-CM | POA: Diagnosis not present

## 2018-04-27 LAB — POC URINE PREG, ED: PREG TEST UR: POSITIVE — AB

## 2018-04-27 NOTE — ED Notes (Signed)
Pt states I am just going to leave before being seen and I will follow up with my doctor. AMA papers signed.

## 2018-04-27 NOTE — ED Triage Notes (Signed)
Pt is requesting a pregnancy test. States last period was 3rd week in April. Pt has been very sleepy and has tender breasts.

## 2018-05-16 ENCOUNTER — Other Ambulatory Visit: Payer: Self-pay

## 2018-05-16 ENCOUNTER — Encounter (HOSPITAL_COMMUNITY): Payer: Self-pay | Admitting: *Deleted

## 2018-05-16 ENCOUNTER — Emergency Department (HOSPITAL_COMMUNITY)
Admission: EM | Admit: 2018-05-16 | Discharge: 2018-05-16 | Disposition: A | Discharge status: Home / Self Care | Payer: Medicaid Other | Attending: Emergency Medicine | Admitting: Emergency Medicine

## 2018-05-16 DIAGNOSIS — Z79899 Other long term (current) drug therapy: Secondary | ICD-10-CM | POA: Insufficient documentation

## 2018-05-16 DIAGNOSIS — Z3202 Encounter for pregnancy test, result negative: Secondary | ICD-10-CM | POA: Insufficient documentation

## 2018-05-16 DIAGNOSIS — B9689 Other specified bacterial agents as the cause of diseases classified elsewhere: Secondary | ICD-10-CM

## 2018-05-16 DIAGNOSIS — N760 Acute vaginitis: Secondary | ICD-10-CM | POA: Insufficient documentation

## 2018-05-16 HISTORY — DX: Complete or unspecified spontaneous abortion without complication: O03.9

## 2018-05-16 LAB — URINALYSIS, ROUTINE W REFLEX MICROSCOPIC
BILIRUBIN URINE: NEGATIVE
Glucose, UA: NEGATIVE mg/dL
Hgb urine dipstick: NEGATIVE
KETONES UR: NEGATIVE mg/dL
Leukocytes, UA: NEGATIVE
NITRITE: NEGATIVE
Protein, ur: NEGATIVE mg/dL
Specific Gravity, Urine: 1.005 (ref 1.005–1.030)
pH: 6 (ref 5.0–8.0)

## 2018-05-16 LAB — HCG, QUANTITATIVE, PREGNANCY

## 2018-05-16 LAB — WET PREP, GENITAL
Sperm: NONE SEEN
Trich, Wet Prep: NONE SEEN
Yeast Wet Prep HPF POC: NONE SEEN

## 2018-05-16 LAB — PREGNANCY, URINE: PREG TEST UR: NEGATIVE

## 2018-05-16 MED ORDER — METRONIDAZOLE 500 MG PO TABS: 500.0000 mg | ORAL_TABLET | Freq: Two times a day (BID) | ORAL | 0 refills | Status: DC

## 2018-05-16 NOTE — ED Notes (Signed)
Pt with vaginal irritation and itching, denies any vaginal discharge or bleeding, pt has not made an appt with an OB/Gyn yet

## 2018-05-16 NOTE — ED Triage Notes (Signed)
Pt c/o vaginal irritation and itching x 2 days. Denies vaginal discharge. Pt was seen here on 04/27/18 and was told she was pregnant. Pt reports she had vaginal bleeding last Wed-Fri but her normal menstrual cycles last for about a week. Pt has not seen an OB since being told she was pregnant.

## 2018-05-16 NOTE — ED Provider Notes (Signed)
[facility] EMERGENCY DEPARTMENT Provider Note   CSN: [id number] Arrival date & time: 05/16/18  1134     History   Chief Complaint Chief Complaint  Patient presents with  . Vaginal Itching    HPI Caroline Weber is a 32 y.o. female presenting with vaginal itching and irritation which has been present for the past several days. She denies vaginal dc.  She is currently G4P0A3, with a positive pregnancy test here on 5/23, stating her LMP was mid April, although she reports having a short 3 day period last week.  She denies abdominal or pelvic pain, denies increased urinary frquency but reports burning pain when she urinates at her labia but suspects this is from the irritation of scratching.  She denies rash, lesions, fevers, chill, n/v or other complaint. She has had no tx prior to arrival. She has not established gyn care for this new pregnancy yet.  The history is provided by the patient.    Past Medical History:  Diagnosis Date  . Medical history non-contributory   . Miscarriage 01/2018    Patient Active Problem List   Diagnosis Date Noted  . Missed ab 01/09/2018  . Nonviable pregnancy 05/27/2017    Past Surgical History:  Procedure Laterality Date  . NO PAST SURGERIES       OB History    Gravida  3   Para      Term      Preterm      AB  3   Living        SAB  3   TAB      Ectopic      Multiple      Live Births           Obstetric Comments  1) blighted ovum 2) first trimester SAB of twins         Home Medications    Prior to Admission medications   Medication Sig Start Date End Date Taking? Authorizing Provider  acetaminophen-codeine (TYLENOL #3) 300-30 MG tablet Take 1-2 tablets by mouth every 4 (four) hours as needed for moderate pain or severe pain. 01/15/18   Leftwich-Kirby, Wilmer Floor, CNM  cyclobenzaprine (FLEXERIL) 5 MG tablet Take 1 tablet (5 mg total) by mouth at bedtime as needed for muscle spasms. 04/04/18   Cathie Hoops, Amy V, PA-C    ibuprofen (ADVIL,MOTRIN) 600 MG tablet Take 1 tablet (600 mg total) by mouth every 6 (six) hours as needed. 01/09/18   Katy Bing, MD  meloxicam (MOBIC) 7.5 MG tablet Take 1 tablet (7.5 mg total) by mouth daily. 04/04/18   Cathie Hoops, Amy V, PA-C  metroNIDAZOLE (FLAGYL) 500 MG tablet Take 1 tablet (500 mg total) by mouth 2 (two) times daily. 05/16/18   Burgess Amor, PA-C  polyethylene glycol powder (GLYCOLAX/MIRALAX) powder Take 17 g by mouth daily. 08/06/17   Lavera Guise, MD  promethazine (PHENERGAN) 25 MG tablet Take 1 tablet (25 mg total) by mouth every 6 (six) hours as needed for nausea or vomiting. 01/09/18   [place] Bing, MD    Family History No family history on file.  Social History Social History   Tobacco Use  . Smoking status: Never Smoker  . Smokeless tobacco: Never Used  Substance Use Topics  . Alcohol use: Yes    Comment: occ  . Drug use: No     Allergies   Patient has no known allergies.   Review of Systems Review of Systems  Constitutional: Negative for fever.  HENT: Negative for congestion and sore throat.   Eyes: Negative.   Respiratory: Negative for chest tightness and shortness of breath.   Cardiovascular: Negative for chest pain.  Gastrointestinal: Negative for abdominal pain, nausea and vomiting.  Genitourinary: Positive for dysuria. Negative for frequency, hematuria, pelvic pain, urgency, vaginal discharge and vaginal pain.       Negative except as mentioned in HPI.   Musculoskeletal: Negative for arthralgias, joint swelling and neck pain.  Skin: Negative.  Negative for rash and wound.  Neurological: Negative for dizziness, weakness, light-headedness, numbness and headaches.  Psychiatric/Behavioral: Negative.      Physical Exam Updated Vital Signs BP 117/82 (BP Location: Right Arm)   Pulse 86   Temp 98.5 F (36.9 C) (Oral)   Resp 16   Ht 5\' 3"  (1.6 m)   Wt 76.2 kg (168 lb)   LMP 03/26/2018 (Approximate)   SpO2 100%   BMI 29.76 kg/m    Physical Exam  Constitutional: She appears well-developed and well-nourished.  HENT:  Head: Normocephalic and atraumatic.  Eyes: Conjunctivae are normal.  Neck: Normal range of motion.  Cardiovascular: Normal rate, regular rhythm, normal heart sounds and intact distal pulses.  Pulmonary/Chest: Effort normal and breath sounds normal. She has no wheezes.  Abdominal: Soft. Bowel sounds are normal. There is no tenderness.  Genitourinary: Uterus normal. There is no rash, tenderness or lesion on the right labia. There is lesion on the left labia. There is no rash or tenderness on the left labia. Uterus is not tender. Cervix exhibits no motion tenderness, no discharge and no friability. Right adnexum displays no mass, no tenderness and no fullness. Left adnexum displays no mass, no tenderness and no fullness. No erythema, tenderness or bleeding in the vagina. Vaginal discharge found.  Genitourinary Comments: Small papule left posterior  Labia. Watery, pale dc.  Musculoskeletal: Normal range of motion.  Neurological: She is alert.  Skin: Skin is warm and dry.  Psychiatric: She has a normal mood and affect.  Nursing note and vitals reviewed.    ED Treatments / Results  Labs (all labs ordered are listed, but only abnormal results are displayed) Labs Reviewed  WET PREP, GENITAL - Abnormal; Notable for the following components:      Result Value   Clue Cells Wet Prep HPF POC PRESENT (*)    WBC, Wet Prep HPF POC MANY (*)    All other components within normal limits  URINALYSIS, ROUTINE W REFLEX MICROSCOPIC - Abnormal; Notable for the following components:   Color, Urine STRAW (*)    All other components within normal limits  PREGNANCY, URINE  HCG, QUANTITATIVE, PREGNANCY  GC/CHLAMYDIA PROBE AMP (Why) NOT AT [facility]    EKG None  Radiology No results found.  Procedures Procedures (including critical care time)  Medications Ordered in ED Medications - No data to  display   Initial Impression / Assessment and Plan / ED Course  I have reviewed the triage vital signs and the nursing notes.  Pertinent labs & imaging results that were available during my care of the patient were reviewed by me and considered in my medical decision making (see chart for details).     Labs reviewed including negative preg and hcg testing today. She did not appear upset or surprised to find she was not pregnant today.  She was started on flagyl, advised f/u with gyn. [facility] [place] [facility], this is her preference for f/u care - referral given.  Final Clinical Impressions(s) / ED Diagnoses   Final diagnoses:  Bacterial vaginosis    ED Discharge Orders        Ordered    metroNIDAZOLE (FLAGYL) 500 MG tablet  2 times daily     05/16/18 1422       Burgess Amor, Cordelia Poche 05/16/18 1432    Rolland Porter, MD 05/16/18 [phone]

## 2018-05-16 NOTE — Discharge Instructions (Addendum)
Take the entire course of flagyl for your infection. Do not drink alcohol while taking this medicine as it will cause nausea and vomiting.

## 2018-05-18 LAB — GC/CHLAMYDIA PROBE AMP (~~LOC~~) NOT AT ARMC
Chlamydia: NEGATIVE
Neisseria Gonorrhea: NEGATIVE

## 2018-05-23 ENCOUNTER — Ambulatory Visit: Payer: Self-pay

## 2019-09-11 ENCOUNTER — Other Ambulatory Visit: Payer: Self-pay

## 2019-09-11 DIAGNOSIS — Z20822 Contact with and (suspected) exposure to covid-19: Secondary | ICD-10-CM

## 2019-09-13 LAB — NOVEL CORONAVIRUS, NAA: SARS-CoV-2, NAA: NOT DETECTED

## 2019-10-29 ENCOUNTER — Other Ambulatory Visit: Payer: Self-pay

## 2019-10-29 DIAGNOSIS — Z20822 Contact with and (suspected) exposure to covid-19: Secondary | ICD-10-CM

## 2019-10-31 LAB — NOVEL CORONAVIRUS, NAA: SARS-CoV-2, NAA: NOT DETECTED

## 2019-11-09 ENCOUNTER — Emergency Department (HOSPITAL_COMMUNITY): Payer: BC Managed Care – PPO

## 2019-11-09 ENCOUNTER — Emergency Department (HOSPITAL_COMMUNITY)
Admission: EM | Admit: 2019-11-09 | Discharge: 2019-11-09 | Disposition: A | Discharge status: Home / Self Care | Payer: BC Managed Care – PPO | Attending: Emergency Medicine | Admitting: Emergency Medicine

## 2019-11-09 ENCOUNTER — Encounter (HOSPITAL_COMMUNITY): Payer: Self-pay | Admitting: Emergency Medicine

## 2019-11-09 ENCOUNTER — Other Ambulatory Visit: Payer: Self-pay

## 2019-11-09 DIAGNOSIS — M25562 Pain in left knee: Secondary | ICD-10-CM | POA: Diagnosis not present

## 2019-11-09 MED ORDER — LIDOCAINE 5 % EX PTCH
1.0000 | MEDICATED_PATCH | CUTANEOUS | Status: DC
  Administered 2019-11-09: 1 via TRANSDERMAL
  Filled 2019-11-09: qty 1

## 2019-11-09 MED ORDER — ACETAMINOPHEN 500 MG PO TABS
1000.0000 mg | ORAL_TABLET | Freq: Once | ORAL | Status: AC
  Administered 2019-11-09: 1000 mg via ORAL
  Filled 2019-11-09: qty 2

## 2019-11-09 MED ORDER — NAPROXEN 250 MG PO TABS
500.0000 mg | ORAL_TABLET | Freq: Once | ORAL | Status: AC
  Administered 2019-11-09: 500 mg via ORAL
  Filled 2019-11-09: qty 2
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
## 2019-11-09 NOTE — ED Notes (Signed)
Patient transported to X-ray 

## 2019-11-09 NOTE — ED Triage Notes (Signed)
Patient slipped and injured her left knee while bowling last night , no LOC , reports pain at left knee with mild swelling .

## 2020-01-07 ENCOUNTER — Ambulatory Visit: Payer: BC Managed Care – PPO | Attending: Internal Medicine

## 2020-01-07 DIAGNOSIS — Z20822 Contact with and (suspected) exposure to covid-19: Secondary | ICD-10-CM

## 2020-01-08 LAB — NOVEL CORONAVIRUS, NAA: SARS-CoV-2, NAA: NOT DETECTED

## 2021-03-06 ENCOUNTER — Inpatient Hospital Stay (HOSPITAL_COMMUNITY)
Admission: AD | Admit: 2021-03-06 | Discharge: 2021-03-06 | Disposition: A | Discharge status: Home / Self Care | Payer: Medicaid Other | Attending: Obstetrics & Gynecology | Admitting: Obstetrics & Gynecology

## 2021-03-06 ENCOUNTER — Other Ambulatory Visit: Payer: Self-pay

## 2021-03-06 ENCOUNTER — Ambulatory Visit (HOSPITAL_COMMUNITY)
Admission: EM | Admit: 2021-03-06 | Discharge: 2021-03-06 | Disposition: A | Discharge status: Home / Self Care | Payer: Medicaid Other

## 2021-03-06 ENCOUNTER — Inpatient Hospital Stay (HOSPITAL_COMMUNITY): Payer: Medicaid Other

## 2021-03-06 ENCOUNTER — Encounter (HOSPITAL_COMMUNITY): Payer: Self-pay | Admitting: Obstetrics & Gynecology

## 2021-03-06 DIAGNOSIS — Z3491 Encounter for supervision of normal pregnancy, unspecified, first trimester: Secondary | ICD-10-CM

## 2021-03-06 DIAGNOSIS — O209 Hemorrhage in early pregnancy, unspecified: Secondary | ICD-10-CM | POA: Diagnosis present

## 2021-03-06 DIAGNOSIS — O99281 Endocrine, nutritional and metabolic diseases complicating pregnancy, first trimester: Secondary | ICD-10-CM

## 2021-03-06 DIAGNOSIS — Z3A01 Less than 8 weeks gestation of pregnancy: Secondary | ICD-10-CM | POA: Diagnosis not present

## 2021-03-06 DIAGNOSIS — E876 Hypokalemia: Secondary | ICD-10-CM | POA: Diagnosis not present

## 2021-03-06 DIAGNOSIS — O360110 Maternal care for anti-D [Rh] antibodies, first trimester, not applicable or unspecified: Secondary | ICD-10-CM | POA: Diagnosis not present

## 2021-03-06 DIAGNOSIS — Z6790 Unspecified blood type, Rh positive: Secondary | ICD-10-CM

## 2021-03-06 DIAGNOSIS — O4690 Antepartum hemorrhage, unspecified, unspecified trimester: Secondary | ICD-10-CM

## 2021-03-06 LAB — CBC WITH DIFFERENTIAL/PLATELET
Abs Immature Granulocytes: 0.05 10*3/uL (ref 0.00–0.07)
Basophils Absolute: 0 10*3/uL (ref 0.0–0.1)
Basophils Relative: 0 %
Eosinophils Absolute: 0.1 10*3/uL (ref 0.0–0.5)
Eosinophils Relative: 0 %
HCT: 40.3 % (ref 36.0–46.0)
Hemoglobin: 13.4 g/dL (ref 12.0–15.0)
Immature Granulocytes: 0 %
Lymphocytes Relative: 22 %
Lymphs Abs: 2.7 10*3/uL (ref 0.7–4.0)
MCH: 29.9 pg (ref 26.0–34.0)
MCHC: 33.3 g/dL (ref 30.0–36.0)
MCV: 90 fL (ref 80.0–100.0)
Monocytes Absolute: 1 10*3/uL (ref 0.1–1.0)
Monocytes Relative: 8 %
Neutro Abs: 8.5 10*3/uL — ABNORMAL HIGH (ref 1.7–7.7)
Neutrophils Relative %: 70 %
Platelets: 307 10*3/uL (ref 150–400)
RBC: 4.48 MIL/uL (ref 3.87–5.11)
RDW: 13.2 % (ref 11.5–15.5)
WBC: 12.4 10*3/uL — ABNORMAL HIGH (ref 4.0–10.5)
nRBC: 0 % (ref 0.0–0.2)

## 2021-03-06 LAB — COMPREHENSIVE METABOLIC PANEL
ALT: 14 U/L (ref 0–44)
AST: 21 U/L (ref 15–41)
Albumin: 3.7 g/dL (ref 3.5–5.0)
Alkaline Phosphatase: 50 U/L (ref 38–126)
Anion gap: 5 (ref 5–15)
BUN: <5 mg/dL — ABNORMAL LOW (ref 6–20)
CO2: 24 mmol/L (ref 22–32)
Calcium: 8.9 mg/dL (ref 8.9–10.3)
Chloride: 107 mmol/L (ref 98–111)
Creatinine, Ser: 0.65 mg/dL (ref 0.44–1.00)
GFR, Estimated: >60 mL/min (ref >60)
Glucose, Bld: 98 mg/dL (ref 70–99)
Potassium: 3.1 mmol/L — ABNORMAL LOW (ref 3.5–5.1)
Sodium: 136 mmol/L (ref 135–145)
Total Bilirubin: 0.6 mg/dL (ref 0.3–1.2)
Total Protein: 6.6 g/dL (ref 6.5–8.1)

## 2021-03-06 LAB — URINALYSIS, ROUTINE W REFLEX MICROSCOPIC
Bilirubin Urine: NEGATIVE
Glucose, UA: NEGATIVE mg/dL
Ketones, ur: NEGATIVE mg/dL
Leukocytes,Ua: NEGATIVE
Nitrite: NEGATIVE
Protein, ur: NEGATIVE mg/dL
Specific Gravity, Urine: 1.003 — ABNORMAL LOW (ref 1.005–1.030)
pH: 6 (ref 5.0–8.0)

## 2021-03-06 LAB — WET PREP, GENITAL
Clue Cells Wet Prep HPF POC: NONE SEEN
Sperm: NONE SEEN
Trich, Wet Prep: NONE SEEN
Yeast Wet Prep HPF POC: NONE SEEN

## 2021-03-06 LAB — HCG, QUANTITATIVE, PREGNANCY: hCG, Beta Chain, Quant, S: 106605 m[IU]/mL — ABNORMAL HIGH (ref <5)

## 2021-03-06 LAB — ABO/RH: ABO/RH(D): O POS

## 2021-03-06 LAB — HIV ANTIBODY (ROUTINE TESTING W REFLEX): HIV Screen 4th Generation wRfx: NONREACTIVE

## 2021-03-06 NOTE — MAU Note (Signed)
Caroline Weber is a 35 y.o. at [redacted]w[redacted]d here in MAU reporting: had some pink bleeding on Saturday and then it stopped. Today it came back, states she sees it when she wipes. Had a small clot today. No pain. No other abnormal discharge,  LMP: 01/15/21  Onset of complaint: today  Pain score: 0/10  Vitals:   03/06/21 1316  BP: 112/65  Pulse: 80  Resp: 16  Temp: 98.2 F (36.8 C)  SpO2: 100%     Lab orders placed from triage: UA, UPT

## 2021-03-06 NOTE — Discharge Instructions (Signed)
Vaginal Bleeding During Pregnancy, First Trimester A small amount of bleeding from the vagina is common during early pregnancy. This kind of bleeding is also called spotting. Sometimes the bleeding is normal and does not cause problems. At other times, though, bleeding may be a sign of something serious. Normal bleeding in pregnancy can happen:  When the fertilized egg attaches itself to your womb.  When blood vessels change because of the pregnancy.  When you have pelvic exams.  When you have sex. Abnormal bleeding can happen:  When you have an infection.  When you have growths in your womb. The growths are called polyps.  If you are having a miscarriage or at risk of having one.  If you have other problems in your pregnancy. Tell your doctor right away about any bleeding from your vagina. Follow these instructions at home: Watch your bleeding  Watch your condition for any changes. Let your doctor know if you are worried about something.  Try to know what causes your bleeding. Ask yourself these questions: ? Does the bleeding start on its own? ? Does the bleeding start after something is done, such as sex or a pelvic exam?  Use a diary to write the things you see about your bleeding. Write in your diary: ? If the bleeding flows freely without stopping, or if it starts and stops, and then starts again. ? If the bleeding is heavy or light. ? How many pads you use in a day and how much blood is in them.  Tell your doctor if you pass tissue. He or she may want to see it.   Activity  Follow your doctor's instructions about how active you can be. Ask what activities are safe for you.  Do not have sex or orgasms until your doctor says that this is safe.  If needed, make plans for someone to help with your normal activities. General instructions  Take over-the-counter and prescription medicines only as told by your doctor.  Do not take aspirin because it can cause  bleeding.  Do not use tampons.  Do not douche.  Keep all follow-up visits. Contact a doctor if:  You have vaginal bleeding at any time while you are pregnant.  You have cramps.  You have a fever or chills. Get help right away if:  You have very bad cramps in your back or belly (abdomen).  You pass large clots or a lot of tissue from your vagina.  Your bleeding gets worse.  You feel light-headed.  You feel weak.  You pass out (faint).  You have chills.  You are leaking fluid from your vagina.  You have a gush of fluid from your vagina. Summary  Sometimes vaginal bleeding during pregnancy is normal and does not cause problems. At other times, bleeding may be a sign of something serious.  Tell your doctor right away about any bleeding from your vagina.  Follow your doctor's instructions about how active you can be. You may need someone to help you with your normal activities.  Keep all follow-up visits. This information is not intended to replace advice given to you by your health care provider. Make sure you discuss any questions you have with your health care provider. Document Revised: 08/14/2020 Document Reviewed: 08/14/2020 Elsevier Patient Education  2021 [employer] Inc.   Hypokalemia Hypokalemia means that the amount of potassium in the blood is lower than normal. Potassium is a chemical (electrolyte) that helps regulate the amount of fluid in the body.  It also stimulates muscle tightening (contraction) and helps nerves work properly. Normally, most of the body's potassium is inside cells, and only a very small amount is in the blood. Because the amount in the blood is so small, minor changes to potassium levels in the blood can be life-threatening. What are the causes? This condition may be caused by:  Antibiotic medicine.  Diarrhea or vomiting. Taking too much of a medicine that helps you have a bowel movement (laxative) can cause diarrhea and lead to  hypokalemia.  Chronic kidney disease (CKD).  Medicines that help the body get rid of excess fluid (diuretics).  Eating disorders, such as bulimia.  Low magnesium levels in the body.  Sweating a lot. What are the signs or symptoms? Symptoms of this condition include:  Weakness.  Constipation.  Fatigue.  Muscle cramps.  Mental confusion.  Skipped heartbeats or irregular heartbeat (palpitations).  Tingling or numbness. How is this diagnosed? This condition is diagnosed with a blood test. How is this treated? This condition may be treated by:  Taking potassium supplements by mouth.  Adjusting the medicines that you take.  Eating more foods that contain a lot of potassium. If your potassium level is very low, you may need to get potassium through an IV and be monitored in the hospital. Follow these instructions at home:  Take over-the-counter and prescription medicines only as told by your health care provider. This includes vitamins and supplements.  Eat a healthy diet. A healthy diet includes fresh fruits and vegetables, whole grains, healthy fats, and lean proteins.  If instructed, eat more foods that contain a lot of potassium. This includes: ? Nuts, such as peanuts and pistachios. ? Seeds, such as sunflower seeds and pumpkin seeds. ? Peas, lentils, and lima beans. ? Whole grain and bran cereals and breads. ? Fresh fruits and vegetables, such as apricots, avocado, bananas, cantaloupe, kiwi, oranges, tomatoes, asparagus, and potatoes. ? Orange juice. ? Tomato juice. ? Red meats. ? Yogurt.  Keep all follow-up visits as told by your health care provider. This is important.   Contact a health care provider if you:  Have weakness that gets worse.  Feel your heart pounding or racing.  Vomit.  Have diarrhea.  Have diabetes (diabetes mellitus) and you have trouble keeping your blood sugar (glucose) in your target range. Get help right away if you:  Have  chest pain.  Have shortness of breath.  Have vomiting or diarrhea that lasts for more than 2 days.  Faint. Summary  Hypokalemia means that the amount of potassium in the blood is lower than normal.  This condition is diagnosed with a blood test.  Hypokalemia may be treated by taking potassium supplements, adjusting the medicines that you take, or eating more foods that are high in potassium.  If your potassium level is very low, you may need to get potassium through an IV and be monitored in the hospital. This information is not intended to replace advice given to you by your health care provider. Make sure you discuss any questions you have with your health care provider. Document Revised: 07/05/2018 Document Reviewed: 07/05/2018 Elsevier Patient Education  2021 [employer].

## 2021-03-06 NOTE — MAU Provider Note (Addendum)
History     CSN: [id number]  Arrival date and time: 03/06/21 1210   Event Date/Time   First Provider Initiated Contact with Patient 03/06/21 1435      Chief Complaint  Patient presents with   Vaginal Bleeding   34 y.o. G4P0030 @[redacted]w[redacted]d  by sure LMP presenting with spotting. Reports onset today. She saw pink when she wiped twice today. Denies recent sex. Denies abd pain or cramping.  OB History     Gravida  4   Para      Term      Preterm      AB  3   Living         SAB  3   IAB      Ectopic      Multiple      Live Births           Obstetric Comments  1) blighted ovum 2) first trimester SAB of twins         Past Medical History:  Diagnosis Date   Medical history non-contributory    Miscarriage 01/2018    Past Surgical History:  Procedure Laterality Date   NO PAST SURGERIES      No family history on file.  Social History   Tobacco Use   Smoking status: Never Smoker   Smokeless tobacco: Never Used  Substance Use Topics   Alcohol use: Yes    Comment: occ   Drug use: No    Allergies: No Known Allergies  Medications Prior to Admission  Medication Sig Dispense Refill Last Dose   acetaminophen-codeine (TYLENOL #3) 300-30 MG tablet Take 1-2 tablets by mouth every 4 (four) hours as needed for moderate pain or severe pain. 6 tablet 0    cyclobenzaprine (FLEXERIL) 5 MG tablet Take 1 tablet (5 mg total) by mouth at bedtime as needed for muscle spasms. 10 tablet 0    ibuprofen (ADVIL,MOTRIN) 600 MG tablet Take 1 tablet (600 mg total) by mouth every 6 (six) hours as needed. 60 tablet 0    meloxicam (MOBIC) 7.5 MG tablet Take 1 tablet (7.5 mg total) by mouth daily. 15 tablet 0    metroNIDAZOLE (FLAGYL) 500 MG tablet Take 1 tablet (500 mg total) by mouth 2 (two) times daily. 14 tablet 0    polyethylene glycol powder (GLYCOLAX/MIRALAX) powder Take 17 g by mouth daily. 500 g 0    promethazine (PHENERGAN) 25 MG tablet Take 1 tablet (25 mg total) by mouth  every 6 (six) hours as needed for nausea or vomiting. 15 tablet 0     Review of Systems  Gastrointestinal: Negative for abdominal pain.  Genitourinary: Positive for vaginal bleeding.   Physical Exam   Blood pressure 112/65, pulse 80, temperature 98.2 F (36.8 C), temperature source Oral, resp. rate 16, height 5\' 3"  (1.6 m), weight 77.2 kg, last menstrual period 01/15/2021, SpO2 100 %, unknown if currently breastfeeding.  Physical Exam Vitals and nursing note reviewed.  Constitutional:      Appearance: Normal appearance.  HENT:     Head: Normocephalic and atraumatic.  Cardiovascular:     Rate and Rhythm: Normal rate.  Pulmonary:     Effort: Pulmonary effort is normal. No respiratory distress.  Abdominal:     General: There is no distension.     Palpations: Abdomen is soft. There is no mass.     Tenderness: There is no abdominal tenderness. There is no guarding or rebound.     Hernia: No hernia is  present.  Musculoskeletal:        General: Normal range of motion.     Cervical back: Normal range of motion.  Skin:    General: Skin is warm and dry.  Neurological:     General: No focal deficit present.     Mental Status: She is alert and oriented to person, place, and time.  Psychiatric:        Mood and Affect: Mood normal.        Behavior: Behavior normal.    Results for orders placed or performed during the hospital encounter of 03/06/21 (from the past 24 hour(s))  Urinalysis, Routine w reflex microscopic Urine, Clean Catch     Status: Abnormal   Collection Time: 03/06/21 12:42 PM  Result Value Ref Range   Color, Urine STRAW (A) YELLOW   APPearance CLEAR CLEAR   Specific Gravity, Urine 1.003 (L) 1.005 - 1.030   pH 6.0 5.0 - 8.0   Glucose, UA NEGATIVE NEGATIVE mg/dL   Hgb urine dipstick MODERATE (A) NEGATIVE   Bilirubin Urine NEGATIVE NEGATIVE   Ketones, ur NEGATIVE NEGATIVE mg/dL   Protein, ur NEGATIVE NEGATIVE mg/dL   Nitrite NEGATIVE NEGATIVE   Leukocytes,Ua  NEGATIVE NEGATIVE   RBC / HPF 0-5 0 - 5 RBC/hpf   WBC, UA 0-5 0 - 5 WBC/hpf   Bacteria, UA RARE (A) NONE SEEN   Squamous Epithelial / LPF 0-5 0 - 5  CBC with Differential/Platelet     Status: Abnormal   Collection Time: 03/06/21  1:37 PM  Result Value Ref Range   WBC 12.4 (H) 4.0 - 10.5 K/uL   RBC 4.48 3.87 - 5.11 MIL/uL   Hemoglobin 13.4 12.0 - 15.0 g/dL   HCT 93.5 70.1 - 77.9 %   MCV 90.0 80.0 - 100.0 fL   MCH 29.9 26.0 - 34.0 pg   MCHC 33.3 30.0 - 36.0 g/dL   RDW 39.0 30.0 - 92.3 %   Platelets 307 150 - 400 K/uL   nRBC 0.0 0.0 - 0.2 %   Neutrophils Relative % 70 %   Neutro Abs 8.5 (H) 1.7 - 7.7 K/uL   Lymphocytes Relative 22 %   Lymphs Abs 2.7 0.7 - 4.0 K/uL   Monocytes Relative 8 %   Monocytes Absolute 1.0 0.1 - 1.0 K/uL   Eosinophils Relative 0 %   Eosinophils Absolute 0.1 0.0 - 0.5 K/uL   Basophils Relative 0 %   Basophils Absolute 0.0 0.0 - 0.1 K/uL   Immature Granulocytes 0 %   Abs Immature Granulocytes 0.05 0.00 - 0.07 K/uL  Comprehensive metabolic panel     Status: Abnormal   Collection Time: 03/06/21  1:37 PM  Result Value Ref Range   Sodium 136 135 - 145 mmol/L   Potassium 3.1 (L) 3.5 - 5.1 mmol/L   Chloride 107 98 - 111 mmol/L   CO2 24 22 - 32 mmol/L   Glucose, Bld 98 70 - 99 mg/dL   BUN <5 (L) 6 - 20 mg/dL   Creatinine, Ser 3.00 0.44 - 1.00 mg/dL   Calcium 8.9 8.9 - 76.2 mg/dL   Total Protein 6.6 6.5 - 8.1 g/dL   Albumin 3.7 3.5 - 5.0 g/dL   AST 21 15 - 41 U/L   ALT 14 0 - 44 U/L   Alkaline Phosphatase 50 38 - 126 U/L   Total Bilirubin 0.6 0.3 - 1.2 mg/dL   GFR, Estimated >26 >33 mL/min   Anion gap 5 5 - 15  ABO/Rh     Status: None   Collection Time: 03/06/21  1:37 PM  Result Value Ref Range   ABO/RH(D)      O POS Performed at [facility], [address] [place], [place] [address]   hCG, quantitative, pregnancy     Status: Abnormal   Collection Time: 03/06/21  1:37 PM  Result Value Ref Range   hCG, Beta Chain, Quant, S 106,605 (H) <5  mIU/mL  HIV Antibody (routine testing w rflx)     Status: None   Collection Time: 03/06/21  1:37 PM  Result Value Ref Range   HIV Screen 4th Generation wRfx Non Reactive Non Reactive  Wet prep, genital     Status: Abnormal   Collection Time: 03/06/21  2:46 PM  Result Value Ref Range   Yeast Wet Prep HPF POC NONE SEEN NONE SEEN   Trich, Wet Prep NONE SEEN NONE SEEN   Clue Cells Wet Prep HPF POC NONE SEEN NONE SEEN   WBC, Wet Prep HPF POC FEW (A) NONE SEEN   Sperm NONE SEEN    US OB LESS THAN 14 WEEKS WITH OB TRANSVAGINAL  Result Date: 03/06/2021 CLINICAL DATA:  Vaginal bleeding. Last menstrual. 01/15/2021. Gestational age by last menstrual period of 7 weeks and 1 day. Estimated due date by last menstrual period 10/22/2021. EXAM: OBSTETRIC <14 WK [place] AND TRANSVAGINAL OB US TECHNIQUE: Both transabdominal and transvaginal ultrasound examinations were performed for complete evaluation of the gestation as well as the maternal uterus, adnexal regions, and pelvic cul-de-sac. Transvaginal technique was performed to assess early pregnancy. COMPARISON:  None FINDINGS: Intrauterine gestational sac: Single Yolk sac:  Visualized. Embryo:  Visualized. Cardiac Activity: Visualized. Heart Rate: 146 bpm CRL: 10.2 mm   7 w   1 d                  [place] EDC: 10/22/2021 Subchorionic hemorrhage:  None visualized. Maternal uterus/adnexae: Bilateral ovaries are unremarkable. The left ovary measures 4 x 2.4 x 2.6 cm. The right ovary measures 4.1 x 1.7 x 1.7 cm. The uterus is unremarkable. Trace simple free fluid within the pelvis. IMPRESSION: Single live intrauterine pregnancy with a gestational age by ultrasound of 7 weeks and 1 day that is concordant with gestational age by last menstrual period of 7 weeks and 1 day. Electronically Signed   By: Tish Frederickson M.D.   On: 03/06/2021 15:41   MAU Course  Procedures  MDM Labs and [place] ordered and reviewed. Normal IUP on [place] no SCH. No signs of impending SAB. Stable for discharge  home.   Assessment and Plan   1. [redacted] weeks gestation of pregnancy   2. Vaginal bleeding during pregnancy   3. Normal IUP (intrauterine pregnancy) on prenatal ultrasound, first trimester   4. Blood type, Rh positive   5. Hypokalemia    Discharge home Follow up at [facility] as scheduled SAB precautions Pelvic rest  Allergies as of 03/06/2021   No Known Allergies      Medication List     STOP taking these medications    acetaminophen-codeine 300-30 MG tablet Commonly known as: TYLENOL #3   cyclobenzaprine 5 MG tablet Commonly known as: FLEXERIL   ibuprofen 600 MG tablet Commonly known as: ADVIL   meloxicam 7.5 MG tablet Commonly known as: Mobic   metroNIDAZOLE 500 MG tablet Commonly known as: FLAGYL   polyethylene glycol powder 17 GM/SCOOP powder Commonly known as: GLYCOLAX/MIRALAX  TAKE these medications    promethazine 25 MG tablet Commonly known as: PHENERGAN Take 1 tablet (25 mg total) by mouth every 6 (six) hours as needed for nausea or vomiting.       Donette LarryMelanie Kodey Xue, CNM 03/06/2021, 4:21 PM

## 2021-03-06 NOTE — ED Notes (Addendum)
Per Provider Pt sent to MAU due to positive pregnancy test on 02/18/21. Pt understands.

## 2021-03-08 LAB — GC/CHLAMYDIA PROBE AMP (~~LOC~~) NOT AT ARMC
Chlamydia: NEGATIVE
Comment: NEGATIVE
Comment: NORMAL
Neisseria Gonorrhea: NEGATIVE

## 2021-03-10 LAB — POCT PREGNANCY, URINE: Preg Test, Ur: POSITIVE — AB

## 2021-03-27 ENCOUNTER — Ambulatory Visit (INDEPENDENT_AMBULATORY_CARE_PROVIDER_SITE_OTHER): Payer: Medicaid Other

## 2021-03-27 VITALS — BP 122/77 | HR 94 | Ht 63.0 in | Wt 170.7 lb

## 2021-03-27 DIAGNOSIS — O0990 Supervision of high risk pregnancy, unspecified, unspecified trimester: Secondary | ICD-10-CM | POA: Insufficient documentation

## 2021-03-27 DIAGNOSIS — O020 Blighted ovum and nonhydatidiform mole: Secondary | ICD-10-CM | POA: Diagnosis not present

## 2021-03-27 DIAGNOSIS — Z3481 Encounter for supervision of other normal pregnancy, first trimester: Secondary | ICD-10-CM

## 2021-03-27 DIAGNOSIS — O26851 Spotting complicating pregnancy, first trimester: Secondary | ICD-10-CM

## 2021-03-27 DIAGNOSIS — Z3491 Encounter for supervision of normal pregnancy, unspecified, first trimester: Secondary | ICD-10-CM | POA: Insufficient documentation

## 2021-03-27 NOTE — Progress Notes (Signed)
PRENATAL INTAKE SUMMARY  Caroline Weber presents today New OB Nurse Interview.  OB History    Gravida  4   Para      Term      Preterm      AB  3   Living  0     SAB  3   IAB      Ectopic      Multiple      Live Births  0        Obstetric Comments  1) blighted ovum 2) first trimester SAB of twins       I have reviewed the patient's medical, obstetrical, social, and family histories, medications, and available lab results.  SUBJECTIVE She complains of having continued intermittent spotting without intercourse.Last noticed spotting 2 days ago. Describes as dark red with mucous. Pt has a hx of 3 previous miscarriages.  OBJECTIVE Initial Physical Exam (New OB)  GENERAL APPEARANCE: alert, well appearing   ASSESSMENT Normal pregnancy  PLAN Prenatal care to be completed at [place] labs to be completed at [facility] provider visit  Baby Scripts Ordered Blood pressure kit ordered U/S performed today due to continued intermittent spotting. Reveals single live IUP PHQ 9 score: 2 GAD 7 score: 2

## 2021-03-30 ENCOUNTER — Other Ambulatory Visit: Payer: Self-pay

## 2021-03-30 ENCOUNTER — Ambulatory Visit (INDEPENDENT_AMBULATORY_CARE_PROVIDER_SITE_OTHER): Payer: Medicaid Other

## 2021-03-30 ENCOUNTER — Other Ambulatory Visit (HOSPITAL_COMMUNITY)
Admission: RE | Admit: 2021-03-30 | Discharge: 2021-03-30 | Disposition: A | Discharge status: Home / Self Care | Payer: Medicaid Other | Source: Ambulatory Visit | Attending: Advanced Practice Midwife | Admitting: Advanced Practice Midwife

## 2021-03-30 ENCOUNTER — Ambulatory Visit (INDEPENDENT_AMBULATORY_CARE_PROVIDER_SITE_OTHER): Payer: Medicaid Other | Admitting: Obstetrics

## 2021-03-30 ENCOUNTER — Encounter: Payer: Self-pay | Admitting: Obstetrics

## 2021-03-30 ENCOUNTER — Encounter: Payer: Medicaid Other | Admitting: Advanced Practice Midwife

## 2021-03-30 VITALS — BP 123/81 | HR 74 | Wt 166.0 lb

## 2021-03-30 DIAGNOSIS — Z3481 Encounter for supervision of other normal pregnancy, first trimester: Secondary | ICD-10-CM

## 2021-03-30 DIAGNOSIS — O2620 Pregnancy care for patient with recurrent pregnancy loss, unspecified trimester: Secondary | ICD-10-CM

## 2021-03-30 DIAGNOSIS — O0990 Supervision of high risk pregnancy, unspecified, unspecified trimester: Secondary | ICD-10-CM

## 2021-03-30 DIAGNOSIS — Z3A10 10 weeks gestation of pregnancy: Secondary | ICD-10-CM

## 2021-03-30 DIAGNOSIS — Z3480 Encounter for supervision of other normal pregnancy, unspecified trimester: Secondary | ICD-10-CM | POA: Diagnosis present

## 2021-03-30 DIAGNOSIS — O368390 Maternal care for abnormalities of the fetal heart rate or rhythm, unspecified trimester, not applicable or unspecified: Secondary | ICD-10-CM

## 2021-03-30 NOTE — Progress Notes (Signed)
Patient was assessed and managed by nursing staff during this encounter. I have reviewed the chart and agree with the documentation and plan. I have also made any necessary editorial changes.  Jaynie Collins, MD 03/30/2021 10:28 AM

## 2021-03-30 NOTE — Progress Notes (Signed)
Subjective:    Caroline Weber is being seen today for her first obstetrical visit.  This is a planned pregnancy. She is at [redacted]w[redacted]d gestation. Her obstetrical history is significant for obesity and 3 consecutive 1st trimester losses prior to this pregnancy. Relationship with FOB: significant other, living together. Patient does intend to breast feed. Pregnancy history fully reviewed.  The information documented in the HPI was reviewed and verified.  Menstrual History: OB History    Gravida  4   Para      Term      Preterm      AB  3   Living  0     SAB  3   IAB      Ectopic      Multiple      Live Births  0        Obstetric Comments  1) blighted ovum 2) first trimester SAB of twins         Patient's last menstrual period was 01/15/2021.    Past Medical History:  Diagnosis Date  . Medical history non-contributory   . Miscarriage 01/2018    Past Surgical History:  Procedure Laterality Date  . NO PAST SURGERIES      (Not in a hospital admission)  No Known Allergies  Social History   Tobacco Use  . Smoking status: Never Smoker  . Smokeless tobacco: Never Used  Substance Use Topics  . Alcohol use: Not Currently    Comment: not since confirmed pregnancy    Family History  Problem Relation Age of Onset  . Healthy Mother   . Hypertension Father      Review of Systems Constitutional: negative for weight loss Gastrointestinal: negative for vomiting Genitourinary:negative for genital lesions and vaginal discharge and dysuria Musculoskeletal:negative for back pain Behavioral/Psych: negative for abusive relationship, depression, illegal drug usage and tobacco use    Objective:    BP 123/81   Pulse 74   Wt 166 lb (75.3 kg)   LMP 01/15/2021   BMI 29.41 kg/m  General Appearance:    Alert, cooperative, no distress, appears stated age  Head:    Normocephalic, without obvious abnormality, atraumatic  Eyes:    PERRL, conjunctiva/corneas clear, EOM's  intact, fundi    benign, both eyes  Ears:    Normal TM's and external ear canals, both ears  Nose:   Nares normal, septum midline, mucosa normal, no drainage    or sinus tenderness  Throat:   Lips, mucosa, and tongue normal; teeth and gums normal  Neck:   Supple, symmetrical, trachea midline, no adenopathy;    thyroid:  no enlargement/tenderness/nodules; no carotid   bruit or JVD  Back:     Symmetric, no curvature, ROM normal, no CVA tenderness  Lungs:     Clear to auscultation bilaterally, respirations unlabored  Chest Wall:    No tenderness or deformity   Heart:    Regular rate and rhythm, S1 and S2 normal, no murmur, rub   or gallop  Breast Exam:    No tenderness, masses, or nipple abnormality  Abdomen:     Soft, non-tender, bowel sounds active all four quadrants,    no masses, no organomegaly  Genitalia:    Normal female without lesion, discharge or tenderness  Extremities:   Extremities normal, atraumatic, no cyanosis or edema  Pulses:   2+ and symmetric all extremities  Skin:   Skin color, texture, turgor normal, no rashes or lesions  Lymph nodes:  Cervical, supraclavicular, and axillary nodes normal  Neurologic:   CNII-XII intact, normal strength, sensation and reflexes    throughout      Lab Review Urine pregnancy test Labs reviewed yes Radiologic studies reviewed yes    Assessment:    Pregnancy at [redacted]w[redacted]d weeks    Plan:     1. Supervision of high risk pregnancy, antepartum Rx: - Cytology - PAP( [facility]) - Culture, OB Urine - CBC/D/Plt+RPR+Rh+ABO+Rub Ab... - Genetic Screening  2. Habitual aborter, antepartum Rx: - AMB referral to maternal fetal medicine - [place] MFM OB Comp Less 14 Wks; Future  3. Unable to hear fetal heart tones as reason for ultrasound scan Rx: - US OB Limited:  FHR = 161 bpm   Prenatal vitamins.  Counseling provided regarding continued use of seat belts, cessation of alcohol consumption, smoking or use of illicit drugs; infection  precautions i.e., influenza/TDAP immunizations, toxoplasmosis,CMV, parvovirus, listeria and varicella; workplace safety, exercise during pregnancy; routine dental care, safe medications, sexual activity, hot tubs, saunas, pools, travel, caffeine use, fish and methlymercury, potential toxins, hair treatments, varicose veins Weight gain recommendations per IOM guidelines reviewed: underweight/BMI< 18.5--> gain 28 - 40 lbs; normal weight/BMI 18.5 - 24.9--> gain 25 - 35 lbs; overweight/BMI 25 - 29.9--> gain 15 - 25 lbs; obese/BMI >30->gain  11 - 20 lbs Problem list reviewed and updated. FIRST/CF mutation testing/NIPT/QUAD SCREEN/fragile X/Ashkenazi Jewish population testing/Spinal muscular atrophy discussed: requested. Role of ultrasound in pregnancy discussed; fetal survey: requested. Amniocentesis discussed: not indicated.   Orders Placed This Encounter  Procedures  . Culture, OB Urine  . US OB Limited    Standing Status:   Future    Number of Occurrences:   1    Standing Expiration Date:   03/30/2022    Order Specific Question:   Reason for Exam (SYMPTOM  OR DIAGNOSIS REQUIRED)    Answer:   Unable to hear heart tones with doppler    Order Specific Question:   Preferred Imaging Location?    Answer:   External  . [place] MFM OB Comp Less 14 Wks    Standing Status:   Future    Standing Expiration Date:   03/30/2022    Order Specific Question:   Reason for Exam (SYMPTOM  OR DIAGNOSIS REQUIRED)    Answer:   Habitual aborter - 3 consecutive 1st trimester losses prior to this pregnancy.    Order Specific Question:   Preferred Location    Answer:   [facility] Ultrasound  . CBC/D/Plt+RPR+Rh+ABO+Rub Ab...  . Genetic Screening  . AMB referral to maternal fetal medicine    Referral Priority:   Routine    Referral Type:   Consultation    Referral Reason:   Specialty Services Required    Number of Visits Requested:   1    Follow up in 4 weeks.  I have spent a total of 20 minutes of face-to-face time,  excluding clinical staff time, reviewing notes and preparing to see patient, ordering tests and/or medications, and counseling the patient.  Brock Bad, MD 03/30/2021 12:16 PM

## 2021-03-31 LAB — CBC/D/PLT+RPR+RH+ABO+RUB AB...
Antibody Screen: NEGATIVE
Basophils Absolute: 0.1 10*3/uL (ref 0.0–0.2)
Basos: 0 %
EOS (ABSOLUTE): 0.1 10*3/uL (ref 0.0–0.4)
Eos: 1 %
HCV Ab: <0.1 s/co ratio (ref 0.0–0.9)
HIV Screen 4th Generation wRfx: NONREACTIVE
Hematocrit: 41 % (ref 34.0–46.6)
Hemoglobin: 13.8 g/dL (ref 11.1–15.9)
Hepatitis B Surface Ag: NEGATIVE
Immature Grans (Abs): 0 10*3/uL (ref 0.0–0.1)
Immature Granulocytes: 0 %
Lymphocytes Absolute: 2.6 10*3/uL (ref 0.7–3.1)
Lymphs: 23 %
MCH: 29.6 pg (ref 26.6–33.0)
MCHC: 33.7 g/dL (ref 31.5–35.7)
MCV: 88 fL (ref 79–97)
Monocytes Absolute: 1 10*3/uL — ABNORMAL HIGH (ref 0.1–0.9)
Monocytes: 9 %
Neutrophils Absolute: 7.8 10*3/uL — ABNORMAL HIGH (ref 1.4–7.0)
Neutrophils: 67 %
Platelets: 290 10*3/uL (ref 150–450)
RBC: 4.66 x10E6/uL (ref 3.77–5.28)
RDW: 12.7 % (ref 11.7–15.4)
RPR Ser Ql: NONREACTIVE
Rh Factor: POSITIVE
Rubella Antibodies, IGG: 1.64 index (ref >0.99)
WBC: 11.7 10*3/uL — ABNORMAL HIGH (ref 3.4–10.8)

## 2021-03-31 LAB — HCV INTERPRETATION

## 2021-04-01 ENCOUNTER — Other Ambulatory Visit: Payer: Self-pay | Admitting: *Deleted

## 2021-04-01 LAB — URINE CULTURE, OB REFLEX

## 2021-04-01 LAB — CULTURE, OB URINE

## 2021-04-01 MED ORDER — PRENATE PIXIE 10-0.6-0.4-200 MG PO CAPS: 1.0000 | ORAL_CAPSULE | Freq: Every day | ORAL | 11 refills | Status: DC

## 2021-04-01 NOTE — Progress Notes (Signed)
Rx sent for Prenatal vitamin.

## 2021-04-03 LAB — CYTOLOGY - PAP
Comment: NEGATIVE
Diagnosis: NEGATIVE
High risk HPV: NEGATIVE

## 2021-04-06 ENCOUNTER — Encounter: Payer: Self-pay | Admitting: Obstetrics

## 2021-04-06 ENCOUNTER — Ambulatory Visit: Payer: Medicaid Other

## 2021-04-20 ENCOUNTER — Other Ambulatory Visit: Payer: Self-pay | Admitting: *Deleted

## 2021-04-20 ENCOUNTER — Ambulatory Visit (HOSPITAL_BASED_OUTPATIENT_CLINIC_OR_DEPARTMENT_OTHER): Payer: Medicaid Other | Admitting: Genetic Counselor

## 2021-04-20 ENCOUNTER — Ambulatory Visit: Payer: Medicaid Other | Attending: Obstetrics and Gynecology

## 2021-04-20 ENCOUNTER — Encounter: Payer: Self-pay | Admitting: *Deleted

## 2021-04-20 ENCOUNTER — Ambulatory Visit: Payer: Medicaid Other | Admitting: *Deleted

## 2021-04-20 ENCOUNTER — Other Ambulatory Visit: Payer: Self-pay

## 2021-04-20 VITALS — BP 117/72 | HR 92

## 2021-04-20 DIAGNOSIS — O2620 Pregnancy care for patient with recurrent pregnancy loss, unspecified trimester: Secondary | ICD-10-CM

## 2021-04-20 DIAGNOSIS — O2621 Pregnancy care for patient with recurrent pregnancy loss, first trimester: Secondary | ICD-10-CM | POA: Insufficient documentation

## 2021-04-20 DIAGNOSIS — N96 Recurrent pregnancy loss: Secondary | ICD-10-CM | POA: Diagnosis present

## 2021-04-20 DIAGNOSIS — Z3A13 13 weeks gestation of pregnancy: Secondary | ICD-10-CM | POA: Diagnosis not present

## 2021-04-20 DIAGNOSIS — Z315 Encounter for genetic counseling: Secondary | ICD-10-CM | POA: Diagnosis not present

## 2021-04-20 DIAGNOSIS — O09291 Supervision of pregnancy with other poor reproductive or obstetric history, first trimester: Secondary | ICD-10-CM | POA: Insufficient documentation

## 2021-04-20 NOTE — Progress Notes (Signed)
04/20/2021  Caroline Weber 1986/02/06 MRN: [id number] DOV: 04/20/2021  Caroline Weber presented to the [facility] for a genetics consultation regarding her history of recurrent pregnancy loss. Caroline Weber was accompanied to her appointment by her friend.   Indication for genetic counseling - Recurrent pregnancy loss  Prenatal history  Caroline Weber is a G35P0030, 35 y.o. female. Her current pregnancy has completed [redacted]w[redacted]d (Estimated Date of Delivery: 10/22/21). Caroline Weber has had three first trimester miscarriages all occurring around 6-7 weeks' gestation with a previous partner. This pregnancy is the first with the current partner.  Caroline Weber denied exposure to environmental toxins or chemical agents. She denied the use of alcohol, tobacco or street drugs. She reported taking prenatal vitamins. She denied significant viral illnesses and fevers during the course of her pregnancy. She reported some spotting around 7-8 weeks' gestation. Her medical and surgical histories were noncontributory.  Family History  A three generation pedigree was drafted and reviewed. The family history is remarkable for the following:  - Caroline Weber had three first trimester miscarriages with a prior partner. Per Caroline Weber, this partner had multiple pregnancies with other women, all of which also ended in miscarriage. He also has a sister with autism. See Discussion section for more details.  - Caroline Weber's maternal half sister had a stillbirth around at around 6-7 months of pregnancy. Caroline Weber did not know the cause of this stillbirth; thus, risk assessment was limited.   The remaining family histories were reviewed and found to be noncontributory for birth defects, intellectual disability, recurrent pregnancy loss, and known genetic conditions. Caroline Weber had limited information about portions of her and the father of the pregnancy's family history; thus, risk assessment was  limited.  The patient's ancestry is [place], [place], [place], [place], and Native Naval architect. The father of the pregnancy's ancestry is African American. Ashkenazi Jewish ancestry and consanguinity were denied. Pedigree will be scanned under Media.  Discussion  Recurrent pregnancy loss:  Caroline Weber was referred for genetic counseling due to her history of recurrent pregnancy loss. She has had 3 spontaneous miscarriages with one prior partner. Each of her miscarriages occurred in the first trimester around 6-7 weeks' gestation. The current pregnancy is with a new partner. This new partner has several healthy children of his own.  We discussed that while 1 in 4 recognized pregnancies end in miscarriage, recurrent pregnancy loss only occurs in 1-3% of women. The prevalence of miscarriage increases with maternal age, and most occur early on in pregnancy.   We reviewed that there are many potential causes of recurrent miscarriages, including anatomic, immunologic, endocrine, and genetic factors. However, a cause is only identified in 50% of individuals who experience recurrent miscarriages. Anatomic factors associated with recurrent miscarriages can include septate uterus, leiomyoma, intrauterine adhesions, and cervical insufficiency. Immunologic factors such as antiphospholipid syndrome, an autoimmune disorder associated with inappropriate blood clot formation, account for 15-40% of recurrent miscarriages. Endocrine factors such as polycystic ovarian syndrome and poorly controlled diabetes can also contribute to recurrent pregnancy loss. Genetic factors may include chromosomal abnormalities and single gene disorders.   Abnormalities of chromosome number or structure are the most common cause of sporadic early pregnancy loss. We discussed that 2-5% of couples who experience multiple miscarriages carry a balanced chromosomal rearrangement that can become unbalanced and potentially lethal in their  offspring. In addition to chromosomal abnormalities, certain single gene, X-linked, and polygenic/multifactorial disorders are associated with recurrent miscarriage. For example, alpha-thalassemia major  and X-linked female-lethal conditions can result in pregnancy loss.   Caroline Weber reported that her prior partner had several pregnancies with other women; however, each of these pregnancies also ended in miscarriage. He has not been able to successfully have any biological children. She also reported that he has a sister with autism. We discussed that based on this history, it is possible that he may carry a balanced chromosomal rearrangement that has led to multiple miscarriages with various partners. Unbalanced chromosomal rearrangements can also be seen in individuals with developmental delays, autism, and/or birth defects, suggesting there could possibly be a chromosomal rearrangement in his family. However, this could not be confirmed without further testing.  Testing options:   Caroline Weber was counseled that karyotyping could assess for chromosomal aberrations such as balanced translocations in [name] that could possibly become unbalanced in their children. While Caroline Weber's previous partner is unavailable for testing, testing her could still be useful to ensure that there is not a maternal chromosomal rearrangement contributing to her history of pregnancy losses. She expressed interest in karyotype analysis.  Aneuploidy screening results:  We also reviewed that Caroline Weber had [facility] noninvasive prenatal screening (NIPS) through the laboratory [facility] that was low-risk for fetal aneuploidies. We reviewed that these results showed a less than 1 in 10,000 risk for trisomies 21, 18 and 13, and monosomy X (Turner syndrome). In addition, the risk for triploidy and sex chromosome trisomies (47,XXX and 47,XXY) was also low. [name] elected to have cfDNA analysis for 22q11.2 deletion syndrome,  which was also low risk (1 in 12,000). We reviewed that while this testing identifies 94-99% of pregnancies with trisomy 67, trisomy 55, and trisomy 45, and >70% of cases of sex chromosome aneuploidies, it is NOT diagnostic. A positive test result requires confirmation by CVS or amniocentesis, and a negative test result does not rule out a fetal chromosome abnormality. She also understands that this testing does not identify all genetic conditions.  Ultrasound:  A first trimester ultrasound was performed today prior to our visit. The ultrasound report will be sent under separate cover. There were no visualized fetal anomalies or markers suggestive of aneuploidy at this early gestational age. Ms. Stoker will return to MFM in 6 weeks for her anatomy ultrasound.  Carrier screening results:  Additionally, we reviewed that Ms. Kirschenmann had Horizon carrier screening performed through [facility], which was negative for 4 conditions. Thus, her risk to be a carrier for these 4 conditions (listed separately in the laboratory report) has been reduced but not eliminated. This puts her pregnancies at significantly decreased risk of being affected by one of these conditions.  Diagnostic testing:  Ms. Dickison was also counseled regarding the option of diagnostic testing via chorionic villus sampling (CVS) or amniocentesis. We discussed the technical aspects of each procedure and quoted up to a 1 in 500 (0.2%) risk for spontaneous pregnancy loss or other adverse pregnancy outcomes as a result of either procedure. Cultured cells from either a placental or amniotic fluid sample allow for the visualization of a fetal karyotype, which can detect >99% of large chromosomal aberrations. Chromosomal microarray can also be performed to identify smaller deletions or duplications of fetal chromosomal material. After careful consideration, Ms. Carlo declined diagnostic testing at this time. She understands that diagnostic testing is  available at any point through the end of pregnancy and that she may opt to undergo the procedure at a later date should she change her mind.   Plan:  Ms. Breisch informed me that she would like to pursue karyotype analysis today. She was planning on having a sample drawn for this testing by our phlebotomist on the first floor; however, per our phlebotomist, Ms. Hidrogo opted to return to have a sample drawn on another day. Results will take 1-2 weeks to be returned from the time Ms. Gangwer has a sample drawn. Once I see that she has had a sample drawn, I will call her with her results.  I counseled Ms. Stoney regarding the above risks and available options. The approximate face-to-face time with the genetic counselor was 35 minutes.  In summary:  Discussed history of recurrent pregnancy loss, including possible causes   Previous partner had multiple pregnancies end in miscarriage with multiple different partners   Reviewed low-risk NIPS results  Reduction in risk for Down syndrome, trisomy 42, trisomy 72, triploidy, sex chromosome aneuploidies, and 22q11.2 deletion syndrome  Reviewed results of ultrasound  No fetal anomalies or markers seen at early gestational age  Will return to MFM for anatomy ultrasound in 6 weeks for anatomy ultrasound  Discussed negative carrier screening results  Negative Horizon-4 carrier screening  Offered additional testing and screening  Interested in karyotype analysis to assess for chromosomal rearrangements. Patient elected not to have sample drawn today per our phlebotomist. Will return to have sample drawn in future  Reviewed family history concerns   Gershon Crane, MS, Aeronautical engineer

## 2021-04-21 DIAGNOSIS — O2621 Pregnancy care for patient with recurrent pregnancy loss, first trimester: Secondary | ICD-10-CM

## 2021-04-21 DIAGNOSIS — Z3A13 13 weeks gestation of pregnancy: Secondary | ICD-10-CM | POA: Diagnosis not present

## 2021-04-27 ENCOUNTER — Ambulatory Visit (INDEPENDENT_AMBULATORY_CARE_PROVIDER_SITE_OTHER): Payer: Medicaid Other | Admitting: Family Medicine

## 2021-04-27 ENCOUNTER — Other Ambulatory Visit: Payer: Self-pay

## 2021-04-27 ENCOUNTER — Ambulatory Visit: Payer: Self-pay

## 2021-04-27 ENCOUNTER — Other Ambulatory Visit: Payer: Medicaid Other

## 2021-04-27 DIAGNOSIS — O2620 Pregnancy care for patient with recurrent pregnancy loss, unspecified trimester: Secondary | ICD-10-CM | POA: Insufficient documentation

## 2021-04-27 DIAGNOSIS — O0990 Supervision of high risk pregnancy, unspecified, unspecified trimester: Secondary | ICD-10-CM

## 2021-04-27 NOTE — Patient Instructions (Signed)

## 2021-04-27 NOTE — Progress Notes (Signed)
   PRENATAL VISIT NOTE  Subjective:  Caroline Weber is a 35 y.o. G4P0030 at [redacted]w[redacted]d being seen today for ongoing prenatal care.  She is currently monitored for the following issues for this high-risk pregnancy and has Supervision of high risk pregnancy, antepartum and Recurrent pregnancy loss, antepartum on their problem list.  Patient reports no complaints.  Contractions: Not present. Vag. Bleeding: None.   . Denies leaking of fluid.   The following portions of the patient's history were reviewed and updated as appropriate: allergies, current medications, past family history, past medical history, past social history, past surgical history and problem list.   Objective:   Vitals:   04/27/21 1000  BP: 116/74  Pulse: 89  Weight: 173 lb (78.5 kg)    Fetal Status: Fetal Heart Rate (bpm): 142         General:  Alert, oriented and cooperative. Patient is in no acute distress.  Skin: Skin is warm and dry. No rash noted.   Cardiovascular: Normal heart rate noted  Respiratory: Normal respiratory effort, no problems with respiration noted  Abdomen: Soft, gravid, appropriate for gestational age.  Pain/Pressure: Absent     Pelvic: Cervical exam deferred        Extremities: Normal range of motion.  Edema: None  Mental Status: Normal mood and affect. Normal behavior. Normal judgment and thought content.   Assessment and Plan:  Pregnancy: G4P0030 at [redacted]w[redacted]d 1. Supervision of high risk pregnancy, antepartum Has anatomy u/s scheduled  2. Recurrent pregnancy loss, antepartum All 1st trimester and thought to be paternal. Awaiting maternal chromosomes.  Preterm labor symptoms and general obstetric precautions including but not limited to vaginal bleeding, contractions, leaking of fluid and fetal movement were reviewed in detail with the patient. Please refer to After Visit Summary for other counseling recommendations.   Return in 4 weeks (on 05/25/2021).  Future Appointments  Date Time  Provider Department Center  05/25/2021  9:55 AM Hurshel Party, CNM [facility] None  06/01/2021  8:50 AM [facility]  06/01/2021  9:30 AM [facility] NURSE [facility]  06/01/2021  9:45 AM [facility] US4 [facility]    Reva Bores, MD

## 2021-04-27 NOTE — Progress Notes (Signed)
OB reports no problems today. 

## 2021-05-08 ENCOUNTER — Telehealth: Payer: Self-pay | Admitting: Obstetrics and Gynecology

## 2021-05-08 LAB — CHROMOSOME, BLOOD, ROUTINE
Cells Analyzed: 20
Cells Counted: 20
Cells Karyotyped: 2
GTG Band Resolution Achieved: 500

## 2021-05-08 NOTE — Telephone Encounter (Signed)
I spoke with Ms. Osgood to relay the results of the recent chromosome analysis which was offered due to her history of recurrent pregnancy loss.  The karyotype results are normal female, 46,XX, showing no numerical or structural chromosome rearrangements which would have been the cause for her losses.  We did review that we cannot exclude a chromosome condition in the father of those pregnancies (he is not the father of this current pregnancy) or other causes for miscarriage.   We may be reached at [phone] with any questions or concerns.  Cherly Anderson, MS, CGC

## 2021-05-25 ENCOUNTER — Encounter: Payer: Medicaid Other | Admitting: Advanced Practice Midwife

## 2021-05-25 NOTE — Progress Notes (Deleted)
   PRENATAL VISIT NOTE  Subjective:  Caroline Weber is a 35 y.o. G4P0030 at 102w4d being seen today for ongoing prenatal care.  She is currently monitored for the following issues for this {Blank single:19197::"high-risk","low-risk"} pregnancy and has Supervision of high risk pregnancy, antepartum and Recurrent pregnancy loss, antepartum on their problem list.  Patient reports {sx:14538}.   .  .   . Denies leaking of fluid.   The following portions of the patient's history were reviewed and updated as appropriate: allergies, current medications, past family history, past medical history, past social history, past surgical history and problem list.   Objective:  There were no vitals filed for this visit.  Fetal Status:           General:  Alert, oriented and cooperative. Patient is in no acute distress.  Skin: Skin is warm and dry. No rash noted.   Cardiovascular: Normal heart rate noted  Respiratory: Normal respiratory effort, no problems with respiration noted  Abdomen: Soft, gravid, appropriate for gestational age.        Pelvic: {Blank single:19197::"Cervical exam performed in the presence of a chaperone","Cervical exam deferred"}        Extremities: Normal range of motion.     Mental Status: Normal mood and affect. Normal behavior. Normal judgment and thought content.   Assessment and Plan:  Pregnancy: G4P0030 at [redacted]w[redacted]d 1. Supervision of high risk pregnancy, antepartum   2. Recurrent pregnancy loss, antepartum   3. [redacted] weeks gestation of pregnancy   {Blank single:19197::"Term","Preterm"} labor symptoms and general obstetric precautions including but not limited to vaginal bleeding, contractions, leaking of fluid and fetal movement were reviewed in detail with the patient. Please refer to After Visit Summary for other counseling recommendations.   No follow-ups on file.  Future Appointments  Date Time Provider Department Center  05/25/2021  9:55 AM Hurshel Party,  CNM [facility] None  06/01/2021  8:50 AM [facility]  06/01/2021  9:30 AM [facility] NURSE [facility]  06/01/2021  9:45 AM [facility] US4 [facility]    Sharen Counter, CNM

## 2021-06-01 ENCOUNTER — Other Ambulatory Visit: Payer: Medicaid Other

## 2021-06-01 ENCOUNTER — Ambulatory Visit: Payer: Medicaid Other

## 2021-06-11 ENCOUNTER — Encounter: Payer: Medicaid Other | Admitting: Advanced Practice Midwife

## 2021-06-23 ENCOUNTER — Ambulatory Visit: Payer: Medicaid Other

## 2021-06-23 ENCOUNTER — Ambulatory Visit: Payer: Medicaid Other | Attending: Obstetrics

## 2021-10-05 ENCOUNTER — Ambulatory Visit: Payer: Medicaid Other

## 2021-10-28 ENCOUNTER — Ambulatory Visit (INDEPENDENT_AMBULATORY_CARE_PROVIDER_SITE_OTHER): Payer: Medicaid Other | Admitting: *Deleted

## 2021-10-28 ENCOUNTER — Other Ambulatory Visit: Payer: Self-pay

## 2021-10-28 VITALS — BP 117/76 | HR 80

## 2021-10-28 DIAGNOSIS — N912 Amenorrhea, unspecified: Secondary | ICD-10-CM | POA: Diagnosis not present

## 2021-10-28 DIAGNOSIS — Z3201 Encounter for pregnancy test, result positive: Secondary | ICD-10-CM

## 2021-10-28 LAB — POCT URINE PREGNANCY: Preg Test, Ur: POSITIVE — AB

## 2021-10-28 NOTE — Progress Notes (Signed)
Ms. Ciresi presents today for UPT. She has no unusual complaints. LMP: 09/29/21    OBJECTIVE: Appears well, in no apparent distress.  OB History     Gravida  4   Para      Term      Preterm      AB  3   Living  0      SAB  3   IAB      Ectopic      Multiple      Live Births  0        Obstetric Comments  1) blighted ovum 2) first trimester SAB of twins        Home UPT Result: positive In-Office UPT result: positive I have reviewed the patient's medical, obstetrical, social, and family histories, and medications.   ASSESSMENT: Positive pregnancy test  PLAN Prenatal care to be completed at: [facility]

## 2021-11-02 ENCOUNTER — Other Ambulatory Visit: Payer: Self-pay

## 2021-11-02 ENCOUNTER — Encounter (HOSPITAL_COMMUNITY): Payer: Self-pay | Admitting: Obstetrics and Gynecology

## 2021-11-02 ENCOUNTER — Inpatient Hospital Stay (HOSPITAL_COMMUNITY)
Admission: AD | Admit: 2021-11-02 | Discharge: 2021-11-02 | Disposition: A | Discharge status: Home / Self Care | Payer: Medicaid Other | Attending: Obstetrics and Gynecology | Admitting: Obstetrics and Gynecology

## 2021-11-02 ENCOUNTER — Inpatient Hospital Stay (HOSPITAL_COMMUNITY): Payer: Medicaid Other

## 2021-11-02 DIAGNOSIS — N939 Abnormal uterine and vaginal bleeding, unspecified: Secondary | ICD-10-CM

## 2021-11-02 DIAGNOSIS — O209 Hemorrhage in early pregnancy, unspecified: Secondary | ICD-10-CM | POA: Diagnosis present

## 2021-11-02 DIAGNOSIS — O3680X0 Pregnancy with inconclusive fetal viability, not applicable or unspecified: Secondary | ICD-10-CM | POA: Insufficient documentation

## 2021-11-02 DIAGNOSIS — Z3A01 Less than 8 weeks gestation of pregnancy: Secondary | ICD-10-CM | POA: Insufficient documentation

## 2021-11-02 LAB — CBC
HCT: 39.9 % (ref 36.0–46.0)
Hemoglobin: 13.5 g/dL (ref 12.0–15.0)
MCH: 30.2 pg (ref 26.0–34.0)
MCHC: 33.8 g/dL (ref 30.0–36.0)
MCV: 89.3 fL (ref 80.0–100.0)
Platelets: 331 10*3/uL (ref 150–400)
RBC: 4.47 MIL/uL (ref 3.87–5.11)
RDW: 13.4 % (ref 11.5–15.5)
WBC: 11.4 10*3/uL — ABNORMAL HIGH (ref 4.0–10.5)
nRBC: 0 % (ref 0.0–0.2)

## 2021-11-02 LAB — COMPREHENSIVE METABOLIC PANEL
ALT: 27 U/L (ref 0–44)
AST: 28 U/L (ref 15–41)
Albumin: 3.7 g/dL (ref 3.5–5.0)
Alkaline Phosphatase: 60 U/L (ref 38–126)
Anion gap: 7 (ref 5–15)
BUN: 5 mg/dL — ABNORMAL LOW (ref 6–20)
CO2: 23 mmol/L (ref 22–32)
Calcium: 8.8 mg/dL — ABNORMAL LOW (ref 8.9–10.3)
Chloride: 103 mmol/L (ref 98–111)
Creatinine, Ser: 0.76 mg/dL (ref 0.44–1.00)
GFR, Estimated: >60 mL/min (ref >60)
Glucose, Bld: 98 mg/dL (ref 70–99)
Potassium: 3.7 mmol/L (ref 3.5–5.1)
Sodium: 133 mmol/L — ABNORMAL LOW (ref 135–145)
Total Bilirubin: 0.4 mg/dL (ref 0.3–1.2)
Total Protein: 6.9 g/dL (ref 6.5–8.1)

## 2021-11-02 LAB — URINALYSIS, ROUTINE W REFLEX MICROSCOPIC
Bilirubin Urine: NEGATIVE
Glucose, UA: NEGATIVE mg/dL
Ketones, ur: NEGATIVE mg/dL
Nitrite: NEGATIVE
Protein, ur: NEGATIVE mg/dL
Specific Gravity, Urine: 1.021 (ref 1.005–1.030)
pH: 5 (ref 5.0–8.0)

## 2021-11-02 LAB — HCG, QUANTITATIVE, PREGNANCY: hCG, Beta Chain, Quant, S: 10 m[IU]/mL — ABNORMAL HIGH (ref <5)

## 2021-11-02 LAB — WET PREP, GENITAL
Clue Cells Wet Prep HPF POC: NONE SEEN
Sperm: NONE SEEN
Trich, Wet Prep: NONE SEEN
WBC, Wet Prep HPF POC: >=10 — AB (ref <10)
Yeast Wet Prep HPF POC: NONE SEEN

## 2021-11-02 NOTE — MAU Note (Signed)
Caroline Weber is a 35 y.o. at [redacted]w[redacted]d here in MAU reporting: started spotting yesterday evening. It is still there this morning. Is wearing a pad and states she only sees a little bit on there. Sees the bleeding mostly when she wipes. Has seen a few small clots. No pain.  Onset of complaint: yesterday  Pain score: 0/10  Vitals:   11/02/21 0711  BP: 119/79  Pulse: 88  Resp: 16  Temp: 98.2 F (36.8 C)  SpO2: 99%     Lab orders placed from triage: UA

## 2021-11-02 NOTE — MAU Provider Note (Addendum)
Patient  Caroline Weber is a 35 y.o. [id number]  At [redacted]w[redacted]d here with complaints of vaginal bleeding that started yesterday at 3 pm. It is only when she wipes after using the toilet.  She denies dysuria, nausea, vomiting, fever, SOB. She denies any other abnormal discharge. She has not had to wear a pad. She denies abdominal pain.  History     CSN: [id number]  Arrival date and time: 11/02/21 8099   Event Date/Time   First Provider Initiated Contact with Patient 11/02/21 [phone]      Chief Complaint  Patient presents with   Vaginal Bleeding   Vaginal Bleeding The patient's primary symptoms include vaginal bleeding. The current episode started yesterday. The problem occurs intermittently. The problem has been unchanged. The patient is experiencing no pain. Pertinent negatives include no abdominal pain, back pain, dysuria, fever, flank pain, urgency or vomiting. The vaginal discharge was bloody. The vaginal bleeding is spotting. She has not been passing clots. She has not been passing tissue.  She sees it mostly when she wipes. She has not had intercourse in the past 24 hours.   OB History     Gravida  5   Para      Term      Preterm      AB  4   Living  0      SAB  3   IAB  1   Ectopic      Multiple      Live Births  0        Obstetric Comments  1) blighted ovum 2) first trimester SAB of twins         Past Medical History:  Diagnosis Date   Medical history non-contributory    Miscarriage 01/2018    Past Surgical History:  Procedure Laterality Date   NO PAST SURGERIES      Family History  Problem Relation Age of Onset   Healthy Mother    Hypertension Father     Social History   Tobacco Use   Smoking status: Never   Smokeless tobacco: Never  Vaping Use   Vaping Use: Never used  Substance Use Topics   Alcohol use: Not Currently    Comment: not since confirmed pregnancy   Drug use: No    Allergies: No Known Allergies  Medications Prior to  Admission  Medication Sig Dispense Refill Last Dose   Prenat-FeAsp-Meth-FA-DHA w/o A (PRENATE PIXIE) 10-0.6-0.4-200 MG CAPS Take 1 capsule by mouth daily. 30 capsule 11    Prenatal Vit-Fe Sulfate-FA-DHA (PRENATAL VITAMIN/MIN +DHA) 27-0.8-200 MG CAPS Take by mouth.       Review of Systems  Constitutional:  Negative for fever.  Gastrointestinal:  Negative for abdominal pain and vomiting.  Genitourinary:  Positive for vaginal bleeding. Negative for dysuria, flank pain and urgency.  Musculoskeletal:  Negative for back pain.  Physical Exam   Blood pressure 119/79, pulse 88, temperature 98.2 F (36.8 C), temperature source Oral, resp. rate 16, height 5\' 3"  (1.6 m), weight 88.5 kg, last menstrual period 09/29/2021, SpO2 99 %, unknown if currently breastfeeding.  Physical Exam Vitals reviewed.  Constitutional:      Appearance: Normal appearance.  HENT:     Head: Normocephalic and atraumatic.  Cardiovascular:     Rate and Rhythm: Normal rate.  Pulmonary:     Effort: Pulmonary effort is normal.  Abdominal:     General: Abdomen is flat. There is no distension.     Palpations: Abdomen is  soft.     Tenderness: There is no abdominal tenderness.  Skin:    General: Skin is warm and dry.     Capillary Refill: Capillary refill takes less than 2 seconds.  Neurological:     General: No focal deficit present.     Mental Status: She is alert and oriented to person, place, and time.  Psychiatric:        Mood and Affect: Mood normal.        Behavior: Behavior normal.        Thought Content: Thought content normal.        Judgment: Judgment normal.   Results for orders placed or performed during the hospital encounter of 11/02/21 (from the past 24 hour(s))  Wet prep, genital     Status: Abnormal   Collection Time: 11/02/21  7:49 AM   Specimen: PATH Cytology Cervicovaginal Ancillary Only  Result Value Ref Range   Yeast Wet Prep HPF POC NONE SEEN NONE SEEN   Trich, Wet Prep NONE SEEN NONE SEEN    Clue Cells Wet Prep HPF POC NONE SEEN NONE SEEN   WBC, Wet Prep HPF POC >=10 (A) <10   Sperm NONE SEEN   CBC     Status: Abnormal   Collection Time: 11/02/21  7:51 AM  Result Value Ref Range   WBC 11.4 (H) 4.0 - 10.5 K/uL   RBC 4.47 3.87 - 5.11 MIL/uL   Hemoglobin 13.5 12.0 - 15.0 g/dL   HCT 62.9 52.8 - 41.3 %   MCV 89.3 80.0 - 100.0 fL   MCH 30.2 26.0 - 34.0 pg   MCHC 33.8 30.0 - 36.0 g/dL   RDW 24.4 01.0 - 27.2 %   Platelets 331 150 - 400 K/uL   nRBC 0.0 0.0 - 0.2 %  Comprehensive metabolic panel     Status: Abnormal   Collection Time: 11/02/21  7:51 AM  Result Value Ref Range   Sodium 133 (L) 135 - 145 mmol/L   Potassium 3.7 3.5 - 5.1 mmol/L   Chloride 103 98 - 111 mmol/L   CO2 23 22 - 32 mmol/L   Glucose, Bld 98 70 - 99 mg/dL   BUN 5 (L) 6 - 20 mg/dL   Creatinine, Ser 5.36 0.44 - 1.00 mg/dL   Calcium 8.8 (L) 8.9 - 10.3 mg/dL   Total Protein 6.9 6.5 - 8.1 g/dL   Albumin 3.7 3.5 - 5.0 g/dL   AST 28 15 - 41 U/L   ALT 27 0 - 44 U/L   Alkaline Phosphatase 60 38 - 126 U/L   Total Bilirubin 0.4 0.3 - 1.2 mg/dL   GFR, Estimated >64 >40 mL/min   Anion gap 7 5 - 15  hCG, quantitative, pregnancy     Status: Abnormal   Collection Time: 11/02/21  7:51 AM  Result Value Ref Range   hCG, Beta Chain, Quant, S 10 (H) <5 mIU/mL  Urinalysis, Routine w reflex microscopic Urine, Clean Catch     Status: Abnormal   Collection Time: 11/02/21  8:20 AM  Result Value Ref Range   Color, Urine YELLOW YELLOW   APPearance HAZY (A) CLEAR   Specific Gravity, Urine 1.021 1.005 - 1.030   pH 5.0 5.0 - 8.0   Glucose, UA NEGATIVE NEGATIVE mg/dL   Hgb urine dipstick LARGE (A) NEGATIVE   Bilirubin Urine NEGATIVE NEGATIVE   Ketones, ur NEGATIVE NEGATIVE mg/dL   Protein, ur NEGATIVE NEGATIVE mg/dL   Nitrite NEGATIVE NEGATIVE   Leukocytes,Ua  TRACE (A) NEGATIVE   RBC / HPF 21-50 0 - 5 RBC/hpf   WBC, UA 6-10 0 - 5 WBC/hpf   Bacteria, UA RARE (A) NONE SEEN   Squamous Epithelial / LPF 11-20 0 - 5    Mucus PRESENT    US OB Comp Less 14 Wks  Result Date: 11/02/2021 CLINICAL DATA:  Vaginal bleeding. EXAM: OBSTETRIC <14 WK [place] AND TRANSVAGINAL OB US TECHNIQUE: Both transabdominal and transvaginal ultrasound examinations were performed for complete evaluation of the gestation as well as the maternal uterus, adnexal regions, and pelvic cul-de-sac. Transvaginal technique was performed to assess early pregnancy. COMPARISON:  None. FINDINGS: Intrauterine gestational sac: None Yolk sac:  Not Visualized. Embryo:  Not Visualized. Cardiac Activity: Not Visualized. Subchorionic hemorrhage:  None visualized. Maternal uterus/adnexae: Ovaries are unremarkable. No free fluid is noted. IMPRESSION: No intrauterine gestational sac, yolk sac, fetal pole, or cardiac activity visualized. Differential considerations include intrauterine gestation too early to be sonographically visualized, spontaneous abortion, or ectopic pregnancy. Consider follow-up ultrasound in 14 days and serial quantitative beta HCG follow-up. Electronically Signed   By: Lupita Raider M.D.   On: 11/02/2021 08:19   US OB Transvaginal  Result Date: 11/02/2021 CLINICAL DATA:  Vaginal bleeding. EXAM: OBSTETRIC <14 WK [place] AND TRANSVAGINAL OB US TECHNIQUE: Both transabdominal and transvaginal ultrasound examinations were performed for complete evaluation of the gestation as well as the maternal uterus, adnexal regions, and pelvic cul-de-sac. Transvaginal technique was performed to assess early pregnancy. COMPARISON:  None. FINDINGS: Intrauterine gestational sac: None Yolk sac:  Not Visualized. Embryo:  Not Visualized. Cardiac Activity: Not Visualized. Subchorionic hemorrhage:  None visualized. Maternal uterus/adnexae: Ovaries are unremarkable. No free fluid is noted. IMPRESSION: No intrauterine gestational sac, yolk sac, fetal pole, or cardiac activity visualized. Differential considerations include intrauterine gestation too early to be sonographically  visualized, spontaneous abortion, or ectopic pregnancy. Consider follow-up ultrasound in 14 days and serial quantitative beta HCG follow-up. Electronically Signed   By: Lupita Raider M.D.   On: 11/02/2021 08:19     MAU Course  Procedures  MDM -due to patient's complaint of vaginal bleeding in early pregnancy, full ectopic work-up was performed.   Assessment and Plan    Charlesetta Garibaldi [facility] 11/02/2021, 7:40 AM    1. Vaginal bleeding in pregnancy, first trimester   2. Vaginal bleeding     hCG 10. Probable SAB. Rpt hCG in 48 hours. Return precations given.  Levie Heritage, DO

## 2021-11-03 LAB — GC/CHLAMYDIA PROBE AMP (~~LOC~~) NOT AT ARMC
Chlamydia: NEGATIVE
Comment: NEGATIVE
Comment: NORMAL
Neisseria Gonorrhea: NEGATIVE

## 2021-11-04 ENCOUNTER — Other Ambulatory Visit: Payer: Self-pay

## 2021-11-04 ENCOUNTER — Other Ambulatory Visit: Payer: Medicaid Other

## 2021-11-04 ENCOUNTER — Ambulatory Visit: Payer: Medicaid Other

## 2021-11-04 DIAGNOSIS — O209 Hemorrhage in early pregnancy, unspecified: Secondary | ICD-10-CM

## 2021-11-04 LAB — BETA HCG QUANT (REF LAB): hCG Quant: 2 m[IU]/mL

## 2021-11-04 NOTE — Progress Notes (Signed)
Pt presents today for STAT BHCG. She was seen at MAU on 11/02/21 for vaginal bleeding in early pregnancy. LMP: 09/29/21. BHCG on 11/02/21 was 10. Pt states she is having some light bleeding today. Denies any pelvic pain or cramping. U/S on 11/02/21 did not show IUP. Will obtain quant today per orders and call patient with results. Ectopic precautions given to patient and advised to report to MAU should those arise. Pt agreed and verbalized understanding.

## 2021-11-04 NOTE — Progress Notes (Signed)
Patient notified of quant result of 2 and that she has had MAB. Pt advised she will not need any further follow up at this time. Pt did ask what she should do is she would like to try for future pregnancy. Advised to wait until she has a normal menstrual cycle and then resume with trying for conception. Pt agreed and verbalized understanding.

## 2021-12-09 ENCOUNTER — Encounter: Payer: Medicaid Other | Admitting: Women's Health

## 2022-01-27 ENCOUNTER — Other Ambulatory Visit: Payer: Self-pay

## 2022-01-27 ENCOUNTER — Ambulatory Visit (HOSPITAL_COMMUNITY)
Admission: EM | Admit: 2022-01-27 | Discharge: 2022-01-27 | Disposition: A | Discharge status: Home / Self Care | Payer: Medicaid Other | Attending: Urgent Care | Admitting: Urgent Care

## 2022-01-27 ENCOUNTER — Encounter (HOSPITAL_COMMUNITY): Payer: Self-pay | Admitting: Emergency Medicine

## 2022-01-27 DIAGNOSIS — N10 Acute tubulo-interstitial nephritis: Secondary | ICD-10-CM | POA: Diagnosis not present

## 2022-01-27 DIAGNOSIS — N760 Acute vaginitis: Secondary | ICD-10-CM | POA: Insufficient documentation

## 2022-01-27 LAB — POCT URINALYSIS DIPSTICK, ED / UC
Bilirubin Urine: NEGATIVE
Glucose, UA: NEGATIVE mg/dL
Ketones, ur: NEGATIVE mg/dL
Nitrite: POSITIVE — AB
Protein, ur: 100 mg/dL — AB
Specific Gravity, Urine: 1.02 (ref 1.005–1.030)
Urobilinogen, UA: 1 mg/dL (ref 0.0–1.0)
pH: 6 (ref 5.0–8.0)

## 2022-01-27 LAB — POC URINE PREG, ED: Preg Test, Ur: NEGATIVE

## 2022-01-27 MED ORDER — CIPROFLOXACIN HCL 500 MG PO TABS: 500.0000 mg | ORAL_TABLET | Freq: Two times a day (BID) | ORAL | 0 refills | Status: AC

## 2022-01-27 NOTE — ED Triage Notes (Signed)
Pt reports right side flank pain x 4 days and vaginal discharge x 1 day.

## 2022-01-27 NOTE — ED Provider Notes (Signed)
[place]    CSN: [id number] Arrival date & time: 01/27/22  [id number]      History   Chief Complaint Chief Complaint  Patient presents with   Flank Pain   Exposure to STD    HPI Caroline Weber is a 36 y.o. female.   Pleasant 36 year old female presents today for concerns of worsening flank pain.  She states 5 days ago she saw her PCP on 217 due to concerns of what she thought was body aches.  She states she had a COVID test and a flu test, which was negative.  She also had blood work which was negative.  She states the symptoms have progressed, now she is having fevers and right flank pain.  She reports some cloudy urine.  She reports increased frequency, but denies dysuria or hematuria.  She took Azo about 2 weeks ago, but none since.  Her PCP recently also put her on fluconazole for suspected yeast infection.  Last dose was 2 weeks ago.  She reports a new onset of vaginal discharge starting just yesterday, she states it is a yellowy orange color.  Denies any odor or itching.   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 Authorizing Provider  ciprofloxacin (CIPRO) 500 MG tablet Take 1 tablet (500 mg total) by mouth 2 (two) times daily for 7 days. 01/27/22 02/03/22 Yes Marco Adelson L, PA  Prenat-FeAsp-Meth-FA-DHA w/o A (PRENATE PIXIE)  10-0.6-0.4-200 MG CAPS Take 1 capsule by mouth daily. 04/01/21   Shelly Bombard, MD    Family History Family History  Problem Relation Age of Onset   Healthy Mother    Hypertension Father     Social History Social History   Tobacco Use   Smoking status: Never   Smokeless tobacco: Never  Vaping Use   Vaping Use: Never used  Substance Use Topics   Alcohol use: Not Currently    Comment: not since confirmed pregnancy   Drug use: No     Allergies   Patient has no known allergies.   Review of Systems Review of Systems  Genitourinary:  Positive for flank pain and vaginal discharge.  All other systems reviewed and are negative.   Physical Exam Triage Vital Signs ED Triage Vitals  Enc Vitals Group     BP 01/27/22 0821 120/75     Pulse Rate 01/27/22 0821 (!) 106     Resp 01/27/22 0821 17     Temp 01/27/22 0821 99.4 F (37.4 C)  Temp Source 01/27/22 0821 Oral     SpO2 01/27/22 0821 100 %     Weight 01/27/22 0820 195 lb 1.7 oz (88.5 kg)     Height 01/27/22 0820 5\' 3"  (1.6 m)     Head Circumference --      Peak Flow --      Pain Score 01/27/22 0820 10     Pain Loc --      Pain Edu? --      Excl. in [place]? --    No data found.  Updated Vital Signs BP 120/75 (BP Location: Right Arm)    Pulse (!) 106    Temp 99.4 F (37.4 C) (Oral)    Resp 17    Ht 5\' 3"  (1.6 m)    Wt 195 lb 1.7 oz (88.5 kg)    LMP 09/29/2021    SpO2 100%    BMI 34.56 kg/m   Visual Acuity Right Eye Distance:   Left Eye Distance:   Bilateral Distance:    Right Eye Near:   Left Eye Near:    Bilateral Near:     Physical Exam Vitals and nursing note reviewed.  Constitutional:      General: She is not in acute distress.    Appearance: Normal appearance. She is well-developed and normal weight. She is not ill-appearing, toxic-appearing or diaphoretic.  HENT:     Head: Normocephalic and atraumatic.     Right Ear: External ear normal.     Left Ear: External ear normal.     Mouth/Throat:      Mouth: Mucous membranes are moist.     Pharynx: No oropharyngeal exudate or posterior oropharyngeal erythema.  Eyes:     Conjunctiva/sclera: Conjunctivae normal.     Pupils: Pupils are equal, round, and reactive to light.  Cardiovascular:     Rate and Rhythm: Normal rate and regular rhythm.     Heart sounds: No murmur heard. Pulmonary:     Effort: Pulmonary effort is normal. No respiratory distress.     Breath sounds: Normal breath sounds.  Abdominal:     General: Abdomen is flat. Bowel sounds are normal. There is no distension.     Palpations: Abdomen is soft. There is no mass.     Tenderness: There is no abdominal tenderness. There is right CVA tenderness. There is no left CVA tenderness or guarding.  Musculoskeletal:        General: No swelling.     Cervical back: Normal range of motion and neck supple. No rigidity.     Right lower leg: No edema.     Left lower leg: No edema.  Lymphadenopathy:     Cervical: No cervical adenopathy.  Skin:    General: Skin is warm and dry.     Capillary Refill: Capillary refill takes less than 2 seconds.     Coloration: Skin is not jaundiced.     Findings: No erythema or rash.  Neurological:     General: No focal deficit present.     Mental Status: She is alert.  Psychiatric:        Mood and Affect: Mood normal.     UC Treatments / Results  Labs (all labs ordered are listed, but only abnormal results are displayed) Labs Reviewed  POCT URINALYSIS DIPSTICK, ED / UC - Abnormal; Notable for the following components:      Result Value   Hgb urine dipstick MODERATE (*)    Protein, ur 100 (*)  Nitrite POSITIVE (*)    Leukocytes,Ua LARGE (*)    All other components within normal limits  URINE CULTURE  POC URINE PREG, ED  CERVICOVAGINAL ANCILLARY ONLY    EKG   Radiology No results found.  Procedures Procedures (including critical care time)  Medications Ordered in UC Medications - No data to display  Initial Impression /  Assessment and Plan / UC Course  I have reviewed the triage vital signs and the nursing notes.  Pertinent labs & imaging results that were available during my care of the patient were reviewed by me and considered in my medical decision making (see chart for details).     Acute pyelonephritis - pt has already been treated by PCP 5 days ago for yeast and tested for viral infections, all of which was negative. Pt now with R flank pain, fever and chills. UA positive for infection. Will start cipro BID x 7 days for developing pyelonephritis, urine cx sent out to monitor antibiotic sensitivity. Vaginal discharge - pt already completed yeast medication. Still having abnormal discharge x 2 days. No pelvic pain. Aptima swab sent out.  Final Clinical Impressions(s) / UC Diagnoses   Final diagnoses:  Acute pyelonephritis  Vaginitis and vulvovaginitis     Discharge Instructions      Your symptoms are consistent with a developing kidney infection, called pyelonephritis. Start taking the antibiotic twice daily until completed. You can take Pyridium up to 3 times daily for maximum of 2 days, this is only to help with the frequency and the pain.  Take on an as-needed basis. We will send out your urine culture and only call if a change in treatment is necessary. We will also call with results of your vaginal swab. Monitor for any change in or worsening symptoms; fever, hematuria or flank pain would warrant a recheck  Please drink plenty of water while on the antibiotic. Eat yogurt to prevent yeast infection or diarrhea. Avoid excessive sunlight as it can make you more sensitive to sun burn. If severe ankle/heel pain develops, please stop the antibiotic and f/u with PCP immediately.      ED Prescriptions     Medication Sig Dispense Auth. Provider   ciprofloxacin (CIPRO) 500 MG tablet Take 1 tablet (500 mg total) by mouth 2 (two) times daily for 7 days. 14 tablet Carolyna Yerian L, [place]      PDMP  not reviewed this encounter.   Chaney Malling, [place] 01/27/22 [phone]

## 2022-01-27 NOTE — Discharge Instructions (Addendum)
Your symptoms are consistent with a developing kidney infection, called pyelonephritis. Start taking the antibiotic twice daily until completed. You can take Pyridium up to 3 times daily for maximum of 2 days, this is only to help with the frequency and the pain.  Take on an as-needed basis. We will send out your urine culture and only call if a change in treatment is necessary. We will also call with results of your vaginal swab. Monitor for any change in or worsening symptoms; fever, hematuria or flank pain would warrant a recheck  Please drink plenty of water while on the antibiotic. Eat yogurt to prevent yeast infection or diarrhea. Avoid excessive sunlight as it can make you more sensitive to sun burn. If severe ankle/heel pain develops, please stop the antibiotic and f/u with PCP immediately.

## 2022-01-28 LAB — CERVICOVAGINAL ANCILLARY ONLY
Bacterial Vaginitis (gardnerella): POSITIVE — AB
Candida Glabrata: NEGATIVE
Candida Vaginitis: NEGATIVE
Chlamydia: NEGATIVE
Comment: NEGATIVE
Comment: NEGATIVE
Comment: NEGATIVE
Comment: NEGATIVE
Comment: NEGATIVE
Comment: NORMAL
Neisseria Gonorrhea: NEGATIVE
Trichomonas: NEGATIVE

## 2022-01-29 ENCOUNTER — Telehealth (HOSPITAL_COMMUNITY): Payer: Self-pay | Admitting: Emergency Medicine

## 2022-01-29 ENCOUNTER — Encounter: Payer: Self-pay | Admitting: Emergency Medicine

## 2022-01-29 LAB — URINE CULTURE: Culture: >=100000 — AB

## 2022-01-29 MED ORDER — METRONIDAZOLE 500 MG PO TABS: 500.0000 mg | ORAL_TABLET | Freq: Two times a day (BID) | ORAL | 0 refills | Status: DC

## 2022-03-26 ENCOUNTER — Encounter (HOSPITAL_COMMUNITY): Payer: Self-pay | Admitting: Emergency Medicine

## 2022-03-26 ENCOUNTER — Ambulatory Visit (HOSPITAL_COMMUNITY)
Admission: EM | Admit: 2022-03-26 | Discharge: 2022-03-26 | Disposition: A | Discharge status: Home / Self Care | Payer: Medicaid Other | Attending: Emergency Medicine | Admitting: Emergency Medicine

## 2022-03-26 DIAGNOSIS — R300 Dysuria: Secondary | ICD-10-CM | POA: Diagnosis not present

## 2022-03-26 DIAGNOSIS — N898 Other specified noninflammatory disorders of vagina: Secondary | ICD-10-CM

## 2022-03-26 LAB — POCT URINALYSIS DIPSTICK, ED / UC
Bilirubin Urine: NEGATIVE
Glucose, UA: NEGATIVE mg/dL
Hgb urine dipstick: NEGATIVE
Ketones, ur: NEGATIVE mg/dL
Nitrite: NEGATIVE
Protein, ur: NEGATIVE mg/dL
Specific Gravity, Urine: 1.02 (ref 1.005–1.030)
Urobilinogen, UA: 0.2 mg/dL (ref 0.0–1.0)
pH: 7 (ref 5.0–8.0)

## 2022-03-26 LAB — POC URINE PREG, ED: Preg Test, Ur: NEGATIVE

## 2022-03-26 NOTE — ED Provider Notes (Signed)
?[facility] ? ? ? ?CSN: [id number] ?Arrival date & time: 03/26/22  2119 ? ? ?  ? ?History   ?Chief Complaint ?Chief Complaint  ?Patient presents with  ? Vaginal Discharge  ? ? ?HPI ?Caroline Weber is a 36 y.o. female.  Patient reports abnormal vaginal discharge for the last 2 days.  Wants to be checked for STIs, yeast, BV and also wants her urine checked for dysuria.  Denies frequency or hematuria.  Denies any other symptoms. ? ? ?Vaginal Discharge ?Associated symptoms: dysuria   ?Associated symptoms: no abdominal pain, no fever, no nausea and no vomiting   ? ?Past Medical History:  ?Diagnosis Date  ? Medical history non-contributory   ? Miscarriage 01/2018  ? ? ?Patient Active Problem List  ? Diagnosis Date Noted  ? 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 ? ?Past Surgical History:  ?Procedure Laterality Date  ? NO PAST SURGERIES    ? ? ?OB History   ? ? Gravida  ?5  ? Para  ?   ? Term  ?   ? Preterm  ?   ? AB  ?5  ? Living  ?0  ?  ? ? SAB  ?4  ? IAB  ?1  ? Ectopic  ?   ? Multiple  ?   ? Live Births  ?0  ?   ?  ? Obstetric Comments  ?1) blighted ovum 2) first trimester SAB of twins  ?  ? ?  ? ? ? ?Home Medications   ? ?Prior to Admission medications   ?Medication Sig Start Date End Date Taking? Authorizing Provider  ?metroNIDAZOLE (FLAGYL) 500 MG tablet Take 1 tablet (500 mg total) by mouth 2 (two) times daily. 01/29/22   LampteyBritta Mccreedy, MD  ?Prenat-FeAsp-Meth-FA-DHA w/o A (PRENATE PIXIE) 10-0.6-0.4-200 MG CAPS Take 1 capsule by mouth daily. 04/01/21   Brock Bad, MD  ? ? ?Family History ?Family History  ?Problem Relation Age of Onset  ? Healthy Mother   ? Hypertension Father   ? ? ?Social History ?Social History  ? ?Tobacco Use  ? Smoking status: Never  ? Smokeless tobacco: Never  ?Vaping Use  ? Vaping Use: Never used  ?Substance Use Topics  ? Alcohol use: Not Currently  ?  Comment: not since confirmed pregnancy  ? Drug use: No  ? ? ? ?Allergies   ?Patient has no known  allergies. ? ? ?Review of Systems ?Review of Systems  ?Constitutional:  Negative for chills and fever.  ?Gastrointestinal:  Negative for abdominal pain, nausea and vomiting.  ?Genitourinary:  Positive for dysuria and vaginal discharge. Negative for flank pain, frequency and hematuria.  ? ? ?Physical Exam ?Triage Vital Signs ?ED Triage Vitals  ?Enc Vitals Group  ?   BP 03/26/22 0902 (!) 145/91  ?   Pulse Rate 03/26/22 0902 92  ?   Resp 03/26/22 0902 16  ?   Temp 03/26/22 0902 98.1 ?F (36.7 ?C)  ?   Temp Source 03/26/22 0902 Oral  ?   SpO2 03/26/22 0901 98 %  ?   Weight 03/26/22 0901 195 lb 1.7 oz (88.5 kg)  ?   Height 03/26/22 0901 5\' 3"  (1.6 m)  ?   Head Circumference --   ?   Peak Flow --   ?   Pain Score 03/26/22 0900 0  ?   Pain Loc --   ?   Pain Edu? --   ?   Excl. in [place]? --   ? ?  No data found. ? ?Updated Vital Signs ?BP (!) 145/91 (BP Location: Left Arm)   Pulse 92   Temp 98.1 ?F (36.7 ?C) (Oral)   Resp 16   Ht 5\' 3"  (1.6 m)   Wt 195 lb 1.7 oz (88.5 kg)   LMP 09/29/2021   SpO2 98%   BMI 34.56 kg/m?  ? ?Visual Acuity ?Right Eye Distance:   ?Left Eye Distance:   ?Bilateral Distance:   ? ?Right Eye Near:   ?Left Eye Near:    ?Bilateral Near:    ? ?Physical Exam ?Constitutional:   ?   Appearance: Normal appearance.  ?Pulmonary:  ?   Effort: Pulmonary effort is normal.  ?Neurological:  ?   Mental Status: She is alert and oriented to person, place, and time.  ? ? ? ?UC Treatments / Results  ?Labs ?(all labs ordered are listed, but only abnormal results are displayed) ?Labs Reviewed  ?POCT URINALYSIS DIPSTICK, ED / UC - Abnormal; Notable for the following components:  ?    Result Value  ? Leukocytes,Ua TRACE (*)   ? All other components within normal limits  ?POC URINE PREG, ED  ?CERVICOVAGINAL ANCILLARY ONLY  ? ? ?EKG ? ? ?Radiology ?No results found. ? ?Procedures ?Procedures (including critical care time) ? ?Medications Ordered in UC ?Medications - No data to display ? ?Initial Impression / Assessment and  Plan / UC Course  ?I have reviewed the triage vital signs and the nursing notes. ? ?Pertinent labs & imaging results that were available during my care of the patient were reviewed by me and considered in my medical decision making (see chart for details). ? ?  ?UTI does not show infection. ? ?Final Clinical Impressions(s) / UC Diagnoses  ? ?Final diagnoses:  ?Vaginal discharge  ?Dysuria  ? ? ? ?Discharge Instructions   ? ?  ?Your urine test today does not show a UTI.  ? ?You will get a call if tests are positive, you will not get a call if tests are negative but you can check results in MyChart if you have a MyChart account.  ? ? ? ?ED Prescriptions   ?None ?  ? ?PDMP not reviewed this encounter. ?  ?10/01/2021, NP ?03/26/22 [phone] ? ?

## 2022-03-26 NOTE — Discharge Instructions (Addendum)
Your urine test today does not show a UTI.  ? ?You will get a call if tests are positive, you will not get a call if tests are negative but you can check results in MyChart if you have a MyChart account.   ?

## 2022-03-26 NOTE — ED Triage Notes (Signed)
Pt presents for STI testing. States she does have vaginal discharge x 2 days but describes it as normal. Denies any other symptoms.  ?

## 2022-03-29 ENCOUNTER — Telehealth (HOSPITAL_COMMUNITY): Payer: Self-pay | Admitting: Emergency Medicine

## 2022-03-29 LAB — CERVICOVAGINAL ANCILLARY ONLY
Bacterial Vaginitis (gardnerella): NEGATIVE
Candida Glabrata: NEGATIVE
Candida Vaginitis: POSITIVE — AB
Chlamydia: NEGATIVE
Comment: NEGATIVE
Comment: NEGATIVE
Comment: NEGATIVE
Comment: NEGATIVE
Comment: NEGATIVE
Comment: NORMAL
Neisseria Gonorrhea: NEGATIVE
Trichomonas: NEGATIVE

## 2022-03-29 MED ORDER — FLUCONAZOLE 150 MG PO TABS: 150.0000 mg | ORAL_TABLET | Freq: Once | ORAL | 0 refills | Status: AC

## 2022-06-13 ENCOUNTER — Ambulatory Visit (HOSPITAL_COMMUNITY)
Admission: RE | Admit: 2022-06-13 | Discharge: 2022-06-13 | Disposition: A | Discharge status: Home / Self Care | Payer: Medicaid Other | Source: Ambulatory Visit | Attending: Physician Assistant | Admitting: Physician Assistant

## 2022-06-13 ENCOUNTER — Encounter (HOSPITAL_COMMUNITY): Payer: Self-pay

## 2022-06-13 VITALS — BP 122/78 | HR 82 | Temp 98.3°F | Resp 16

## 2022-06-13 DIAGNOSIS — Z113 Encounter for screening for infections with a predominantly sexual mode of transmission: Secondary | ICD-10-CM

## 2022-06-13 DIAGNOSIS — N898 Other specified noninflammatory disorders of vagina: Secondary | ICD-10-CM

## 2022-06-13 DIAGNOSIS — B3731 Acute candidiasis of vulva and vagina: Secondary | ICD-10-CM

## 2022-06-13 LAB — POC URINE PREG, ED: Preg Test, Ur: NEGATIVE

## 2022-06-13 MED ORDER — FLUCONAZOLE 150 MG PO TABS: 150.0000 mg | ORAL_TABLET | Freq: Every day | ORAL | 0 refills | Status: DC

## 2022-06-13 NOTE — ED Triage Notes (Signed)
Pt reports "thick white" vaginal discharge x 2 days. Requesting STI screening.

## 2022-06-13 NOTE — ED Provider Notes (Signed)
[facility]    CSN: [id number] Arrival date & time: 06/13/22  1508      History   Chief Complaint Chief Complaint  Patient presents with   Vaginal Discharge    HPI Caroline Weber is a 36 y.o. female.   36 year old female presents with vaginal discharge and STI screening.  She indicates that for the past 1 to 2 days she has been having vaginal discharge, white and thick, with external irritation and itching.  Patient indicates that she had intercourse about 4 days ago, unprotected.  Patient relates that she is not on birth control.  Patient request to be screened for STI, but defers having HIV and RPR testing as she relates having this done about a month ago at her PCPs office.  Patient relates she is not having fever or chills, no lower back pain, no urinary symptoms such as frequency, urgency, or dysuria.  Patient is tolerating fluids well.  Patient relates that her last menses was 15 June and it was normal.  Patient relates she should start her menses any day now.   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 Authorizing Provider  fluconazole (DIFLUCAN) 150 MG tablet Take 1 tablet (150 mg total) by mouth daily. Initially, then may repeat in 4-5 days if needed. 06/13/22  Yes Ellsworth Lennox, PA-C  metroNIDAZOLE (FLAGYL) 500 MG tablet Take 1 tablet (500 mg  total) by mouth 2 (two) times daily. 01/29/22   Lamptey, Britta Mccreedy, MD  Prenat-FeAsp-Meth-FA-DHA w/o A (PRENATE PIXIE) 10-0.6-0.4-200 MG CAPS Take 1 capsule by mouth daily. 04/01/21   Brock Bad, MD    Family History Family History  Problem Relation Age of Onset   Healthy Mother    Hypertension Father     Social History Social History   Tobacco Use   Smoking status: Never   Smokeless tobacco: Never  Vaping Use   Vaping Use: Never used  Substance Use Topics   Alcohol use: Not Currently    Comment: not since confirmed pregnancy   Drug use: No     Allergies   Patient has no known allergies.   Review of Systems Review of Systems  Genitourinary:  Positive for vaginal discharge (white and thick with itching).     Physical Exam Triage Vital Signs ED Triage Vitals  Enc Vitals Group     BP 06/13/22 1532 122/78     Pulse Rate 06/13/22 1532 82     Resp 06/13/22 1532 16     Temp 06/13/22 1532 98.3  F (36.8 C)     Temp Source 06/13/22 1532 Oral     SpO2 06/13/22 1532 100 %     Weight --      Height --      Head Circumference --      Peak Flow --      Pain Score 06/13/22 1531 0     Pain Loc --      Pain Edu? --      Excl. in GC? --    No data found.  Updated Vital Signs BP 122/78 (BP Location: Left Arm)   Pulse 82   Temp 98.3 F (36.8 C) (Oral)   Resp 16   LMP 09/29/2021   SpO2 100%   Visual Acuity Right Eye Distance:   Left Eye Distance:   Bilateral Distance:    Right Eye Near:   Left Eye Near:    Bilateral Near:     Physical Exam Constitutional:      Appearance: Normal appearance.  Abdominal:     General: Abdomen is flat. Bowel sounds are normal.     Palpations: Abdomen is soft.     Tenderness: There is no abdominal tenderness.  Neurological:     Mental Status: She is alert.      UC Treatments / Results  Labs (all labs ordered are listed, but only abnormal results are displayed) Labs Reviewed  POC URINE PREG, ED  CERVICOVAGINAL  ANCILLARY ONLY    EKG   Radiology No results found.  Procedures Procedures (including critical care time)  Medications Ordered in UC Medications - No data to display  Initial Impression / Assessment and Plan / UC Course  I have reviewed the triage vital signs and the nursing notes.  Pertinent labs & imaging results that were available during my care of the patient were reviewed by me and considered in my medical decision making (see chart for details).    Plan: 1.  STI testing for GC/chlamydia/trichomonas/BV the/yeast is pending. 2.  Advised patient to take the Diflucan 150 mg 1 tablet initially and then may repeat in 5 days to treat the yeast infection. 3.  Advised patient to follow-up with PCP or return to urgent care if symptoms fail to improve Final Clinical Impressions(s) / UC Diagnoses   Final diagnoses:  Vaginal itching  Vaginal yeast infection  Routine screening for STI (sexually transmitted infection)     Discharge Instructions      Advised to take the Diflucan tablet 1 tablet initially and then you may repeat in 4 to 5 days if symptoms fail to improve. Advised to follow-up with PCP or return to urgent care if symptoms fail to improve.    ED Prescriptions     Medication Sig Dispense Auth. Provider   fluconazole (DIFLUCAN) 150 MG tablet Take 1 tablet (150 mg total) by mouth daily. Initially, then may repeat in 4-5 days if needed. 2 tablet Ellsworth Lennox, PA-C      PDMP not reviewed this encounter.   Ellsworth Lennox, PA-C 06/13/22 1601

## 2022-06-13 NOTE — Discharge Instructions (Signed)
Advised to take the Diflucan tablet 1 tablet initially and then you may repeat in 4 to 5 days if symptoms fail to improve. Advised to follow-up with PCP or return to urgent care if symptoms fail to improve.

## 2022-06-14 LAB — CERVICOVAGINAL ANCILLARY ONLY
Bacterial Vaginitis (gardnerella): POSITIVE — AB
Candida Glabrata: NEGATIVE
Candida Vaginitis: POSITIVE — AB
Chlamydia: NEGATIVE
Comment: NEGATIVE
Comment: NEGATIVE
Comment: NEGATIVE
Comment: NEGATIVE
Comment: NEGATIVE
Comment: NORMAL
Neisseria Gonorrhea: NEGATIVE
Trichomonas: NEGATIVE

## 2022-06-15 ENCOUNTER — Telehealth (HOSPITAL_COMMUNITY): Payer: Self-pay | Admitting: Emergency Medicine

## 2022-06-15 MED ORDER — METRONIDAZOLE 500 MG PO TABS: 500.0000 mg | ORAL_TABLET | Freq: Two times a day (BID) | ORAL | 0 refills | Status: DC

## 2022-08-02 ENCOUNTER — Ambulatory Visit (HOSPITAL_COMMUNITY)
Admission: EM | Admit: 2022-08-02 | Discharge: 2022-08-02 | Disposition: A | Discharge status: Home / Self Care | Payer: Medicaid Other | Attending: Internal Medicine | Admitting: Internal Medicine

## 2022-08-02 ENCOUNTER — Encounter (HOSPITAL_COMMUNITY): Payer: Self-pay | Admitting: *Deleted

## 2022-08-02 DIAGNOSIS — Z113 Encounter for screening for infections with a predominantly sexual mode of transmission: Secondary | ICD-10-CM | POA: Insufficient documentation

## 2022-08-02 DIAGNOSIS — Z3202 Encounter for pregnancy test, result negative: Secondary | ICD-10-CM | POA: Diagnosis not present

## 2022-08-02 DIAGNOSIS — M542 Cervicalgia: Secondary | ICD-10-CM | POA: Insufficient documentation

## 2022-08-02 LAB — HIV ANTIBODY (ROUTINE TESTING W REFLEX): HIV Screen 4th Generation wRfx: NONREACTIVE

## 2022-08-02 LAB — POC URINE PREG, ED: Preg Test, Ur: NEGATIVE

## 2022-08-02 MED ORDER — IBUPROFEN 600 MG PO TABS: 600.0000 mg | ORAL_TABLET | Freq: Four times a day (QID) | ORAL | 0 refills | Status: DC | PRN

## 2022-08-02 NOTE — Discharge Instructions (Addendum)
Your STD testing has been sent to the lab and will come back in the next 2 to 3 days.  We will call you if any of your results are positive requiring treatment and treat you at that time.   Avoid sexual intercourse until your STD results come back.  If any of your STD results are positive, you will need to avoid sexual intercourse for 7 days while you are being treated to prevent spread of STD.  Condom use is the best way to prevent spread of STDs.  You may take ibuprofen every 6 hours at home as needed with food for neck pain and inflammation.  Apply heat with a heating pad to your neck, upper back, and shoulders to reduce muscle spasm and tension and perform gentle range of motion exercises as well to help with your symptoms. Return to urgent care if your symptoms worsen.  I hope you feel better!

## 2022-08-02 NOTE — ED Triage Notes (Signed)
Pt states that she has been having some vaginal discharge so would like STI testing and pregnancy testing today. No known exposures.   Pt states that her left arm and hand go numb from time to time.

## 2022-08-02 NOTE — ED Provider Notes (Signed)
[facility]    CSN: [id number] Arrival date & time: 08/02/22  0803      History   Chief Complaint Chief Complaint  Patient presents with   SEXUALLY TRANSMITTED DISEASE    HPI Caroline Weber is a 35 y.o. female.   Patient presents urgent care for evaluation of vaginal discharge that is a milky white color.  She for started noticing this a few days ago.  Denies recent new sexual partners although she is unsure of possible secondary exposure through her current female partner as she suspects he may have been unfaithful recently.  Denies vaginal itching, odor, and rash.  Also requesting pregnancy testing today.  Last menstrual cycle started July 10, 2022 and was normal and regular.  No abdominal discomfort, nausea, vomiting, urinary symptoms, constipation, diarrhea, URI symptoms, dizziness, and fever/chills.  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  Denies pain to the left arm and states that she intermittently will develop bilateral muscular neck pain associated with the numbness/tingling to the left arm when it happens.  She cannot identify any relieving factors for left arm numbness and tingling and has no attempted use of any medications prior to arrival urgent care for symptoms.  Denies numbness and tingling to the left arm and neck pain at this time.  No headache, dizziness, difficulty moving neck, neck stiffness, weakness to the bilateral upper extremities, or previous injury to either the left arm or the neck.     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 Authorizing Provider  ibuprofen (ADVIL) 600 MG tablet Take 1 tablet (600 mg total) by mouth every 6 (six) hours as needed. 08/02/22  Yes Carlisle Beers, FNP  fluconazole (DIFLUCAN) 150 MG tablet Take 1 tablet (150 mg total) by mouth daily. Initially, then may repeat in 4-5 days if needed. 06/13/22   Ellsworth Lennox, PA-C  metroNIDAZOLE (FLAGYL) 500 MG tablet Take 1 tablet (500 mg total) by mouth 2 (two) times daily. 06/15/22   Lamptey, Britta Mccreedy, MD  Prenat-FeAsp-Meth-FA-DHA w/o A (PRENATE PIXIE) 10-0.6-0.4-200 MG CAPS Take 1 capsule by mouth daily. 04/01/21   Brock Bad, MD    Family History Family History  Problem Relation Age of Onset   Healthy Mother    Hypertension Father     Social History Social History   Tobacco Use   Smoking status: Never   Smokeless tobacco: Never  Vaping Use   Vaping Use:  Never used  Substance Use Topics   Alcohol use: Not Currently    Comment: not since confirmed pregnancy   Drug use: No     Allergies   Patient has no known allergies.   Review of Systems Review of Systems Per HPI  Physical Exam Triage Vital Signs ED Triage Vitals  Enc Vitals Group     BP 08/02/22 0820 120/83     Pulse Rate 08/02/22 0820 75     Resp 08/02/22 0820 18     Temp 08/02/22 0820 98.2 F (36.8 C)     Temp Source 08/02/22 0820 Oral     SpO2 08/02/22 0820 98 %     Weight --      Height --      Head Circumference --      Peak Flow --      Pain Score 08/02/22 0819 0     Pain Loc --      Pain Edu? --      Excl. in [place]? --    No data found.  Updated Vital Signs BP 120/83 (BP Location: Right Arm)   Pulse 75   Temp 98.2 F (36.8 C) (Oral)   Resp 18    LMP 07/10/2022 (Approximate)   SpO2 98%   Visual Acuity Right Eye Distance:   Left Eye Distance:   Bilateral Distance:    Right Eye Near:   Left Eye Near:    Bilateral Near:     Physical Exam Vitals and nursing note reviewed.  Constitutional:      Appearance: Normal appearance. She is not ill-appearing or toxic-appearing.     Comments: Very pleasant patient sitting on exam in position of comfort table in no acute distress.   HENT:     Head: Normocephalic and atraumatic.     Right Ear: Hearing and external ear normal.     Left Ear: Hearing and external ear normal.     Nose: Nose normal.     Mouth/Throat:     Lips: Pink.     Mouth: Mucous membranes are moist.  Eyes:     General: Lids are normal. Vision grossly intact. Gaze aligned appropriately.     Extraocular Movements: Extraocular movements intact.     Conjunctiva/sclera: Conjunctivae normal.  Cardiovascular:     Rate and Rhythm: Normal rate and regular rhythm.     Heart sounds: Normal heart sounds, S1 normal and S2 normal.  Pulmonary:     Effort: Pulmonary effort is normal. No respiratory distress.     Breath sounds: Normal breath sounds and air entry.  Abdominal:     Palpations: Abdomen is soft.  Genitourinary:    Comments: Deferred. Musculoskeletal:        General: Normal range of motion.     Cervical back: Neck supple.     Comments: 5/5 strength against resistance with head movement bilaterally at the neck.  No tenderness to the cervical spinous processes or paraspinal cervical muscles.  Full range of motion to the left shoulder and left arm.  +2 radial pulses bilaterally.  Sensation intact bilaterally.  Capillary refill less than 2.  Skin:    General: Skin is warm and dry.     Capillary Refill: Capillary refill takes less than 2 seconds.     Findings: No rash.  Neurological:     General: No focal deficit present.     Mental Status: She is alert and oriented to person, place, and time. Mental status is at  baseline.     Cranial Nerves: No dysarthria or facial asymmetry.     Gait: Gait is intact.  Psychiatric:        Mood and Affect: Mood normal.        Speech: Speech normal.        Behavior: Behavior normal.        Thought Content: Thought content normal.        Judgment: Judgment normal.      UC Treatments / Results  Labs (all labs ordered are listed, but only abnormal results are displayed) Labs Reviewed  HIV ANTIBODY (ROUTINE TESTING W REFLEX)  RPR  POC URINE PREG, ED  CERVICOVAGINAL ANCILLARY ONLY    EKG   Radiology No results found.  Procedures Procedures (including critical care time)  Medications Ordered in UC Medications - No data to display  Initial Impression / Assessment and Plan / UC Course  I have reviewed the triage vital signs and the nursing notes.  Pertinent labs & imaging results that were available during my care of the patient were reviewed by me and considered in my medical decision making (see chart for details).   1.  Screen for STD STI labs pending.  Patient would like HIV and syphilis testing today.  Will notify patient of positive results and treat accordingly when labs come back.  Patient to avoid sexual intercourse until screening testing comes back.  Education provided regarding safe sexual practices and patient encouraged to use protection to prevent spread of STIs.  Urine pregnancy test is negative.   2.  Neck pain Neck pain is likely musculoskeletal in nature and consistent with muscle strain intermittently due to overuse.  Advised patient to take ibuprofen 600 mg every 6 hours as needed when symptoms begin to reduce inflammation and pain to the neck causing numbness and tingling to the left arm.  She may also use heat and perform gentle range of motion exercises to reduce inflammation.  Deferred imaging due to stable musculoskeletal exam and hemodynamically stable vital signs.  Discussed physical exam and available lab work findings in  clinic with patient.  Counseled patient regarding appropriate use of medications and potential side effects for all medications recommended or prescribed today. Discussed red flag signs and symptoms of worsening condition,when to call the PCP office, return to urgent care, and when to seek higher level of care in the emergency department. Patient verbalizes understanding and agreement with plan. All questions answered. Patient discharged in stable condition.  Final Clinical Impressions(s) / UC Diagnoses   Final diagnoses:  Screen for STD (sexually transmitted disease)  Neck pain  Negative pregnancy test     Discharge Instructions      Your STD testing has been sent to the lab and will come back in the next 2 to 3 days.  We will call you if any of your results are positive requiring treatment and treat you at that time.   Avoid sexual intercourse until your STD results come back.  If any of your STD results are positive, you will need to avoid sexual intercourse for 7 days while you are being treated to prevent spread of STD.  Condom use is the best way to prevent spread of STDs.  You may take ibuprofen every 6 hours at home as needed with food for neck pain and inflammation.  Apply heat with a heating pad to your neck, upper back, and shoulders to reduce muscle spasm and tension and perform gentle range of motion  exercises as well to help with your symptoms. Return to urgent care if your symptoms worsen.  I hope you feel better!     ED Prescriptions     Medication Sig Dispense Auth. Provider   ibuprofen (ADVIL) 600 MG tablet Take 1 tablet (600 mg total) by mouth every 6 (six) hours as needed. 30 tablet Talbot Grumbling, FNP      PDMP not reviewed this encounter.   Joella Prince [place], [place] 08/02/22 [phone]

## 2022-08-03 ENCOUNTER — Telehealth (HOSPITAL_COMMUNITY): Payer: Self-pay | Admitting: Emergency Medicine

## 2022-08-03 LAB — CERVICOVAGINAL ANCILLARY ONLY
Bacterial Vaginitis (gardnerella): NEGATIVE
Candida Glabrata: NEGATIVE
Candida Vaginitis: POSITIVE — AB
Chlamydia: NEGATIVE
Comment: NEGATIVE
Comment: NEGATIVE
Comment: NEGATIVE
Comment: NEGATIVE
Comment: NEGATIVE
Comment: NORMAL
Neisseria Gonorrhea: NEGATIVE
Trichomonas: NEGATIVE

## 2022-08-03 LAB — RPR: RPR Ser Ql: NONREACTIVE

## 2022-08-03 MED ORDER — FLUCONAZOLE 150 MG PO TABS: 150.0000 mg | ORAL_TABLET | Freq: Once | ORAL | 0 refills | Status: AC

## 2022-09-27 ENCOUNTER — Encounter (HOSPITAL_COMMUNITY): Payer: Self-pay | Admitting: Emergency Medicine

## 2022-09-27 ENCOUNTER — Ambulatory Visit (HOSPITAL_COMMUNITY)
Admission: EM | Admit: 2022-09-27 | Discharge: 2022-09-27 | Disposition: A | Discharge status: Home / Self Care | Payer: Medicaid Other | Attending: Internal Medicine | Admitting: Internal Medicine

## 2022-09-27 DIAGNOSIS — R102 Pelvic and perineal pain: Secondary | ICD-10-CM | POA: Diagnosis not present

## 2022-09-27 DIAGNOSIS — Z113 Encounter for screening for infections with a predominantly sexual mode of transmission: Secondary | ICD-10-CM | POA: Diagnosis present

## 2022-09-27 DIAGNOSIS — Z202 Contact with and (suspected) exposure to infections with a predominantly sexual mode of transmission: Secondary | ICD-10-CM | POA: Diagnosis not present

## 2022-09-27 LAB — POC URINE PREG, ED: Preg Test, Ur: NEGATIVE

## 2022-09-27 LAB — POCT URINALYSIS DIPSTICK, ED / UC
Bilirubin Urine: NEGATIVE
Glucose, UA: NEGATIVE mg/dL
Hgb urine dipstick: NEGATIVE
Ketones, ur: NEGATIVE mg/dL
Leukocytes,Ua: NEGATIVE
Nitrite: NEGATIVE
Protein, ur: NEGATIVE mg/dL
Specific Gravity, Urine: >=1.03 (ref 1.005–1.030)
Urobilinogen, UA: 0.2 mg/dL (ref 0.0–1.0)
pH: 5.5 (ref 5.0–8.0)

## 2022-09-27 NOTE — Discharge Instructions (Signed)
Your STD testing has been sent to the lab and will come back in the next 2 to 3 days.  We will call you if any of your results are positive requiring treatment and treat you at that time.   Avoid sexual intercourse until your STD results come back.  If any of your STD results are positive, you will need to avoid sexual intercourse for 7 days while you are being treated to prevent spread of STD.  Condom use is the best way to prevent spread of STDs.  Return to urgent care as needed.  

## 2022-09-27 NOTE — ED Provider Notes (Signed)
[facility]    CSN: [id number] Arrival date & time: 09/27/22  0175      History   Chief Complaint Chief Complaint  Patient presents with   Possible Pregnancy   SEXUALLY TRANSMITTED DISEASE    HPI Caroline Weber is a 36 y.o. female.   Patient presents urgent care for evaluation of pelvic pain that started after unprotected sexual intercourse 4 days ago on Thursday, September 23, 2022.  She reports the vaginal area feels "sore".  Denies recent new sexual partners or known exposure to STD.  She is not having any vaginal discharge, itching, or odor.  Denies vaginal rash, nausea, vomiting, dizziness, abdominal pain, fever/chills, urinary frequency, urgency, or hesitancy.  She is reporting some dysuria but denies excessive intake of urinary tense recently.  She did not use lubrication during recent sexual encounter or condom.  Last menstrual cycle was September 03, 2022 and she would like pregnancy testing today as well as STD testing.  She has not attempted use of any over-the-counter medications prior to arrival urgent care and denies history of PCOS/PID.   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 Authorizing Provider  ibuprofen (ADVIL) 600 MG tablet Take 1 tablet (600 mg total) by mouth every 6 (six) hours as needed. 08/02/22   Talbot Grumbling,  FNP  metroNIDAZOLE (FLAGYL) 500 MG tablet Take 1 tablet (500 mg total) by mouth 2 (two) times daily. 06/15/22   Lamptey, Myrene Galas, MD  Prenat-FeAsp-Meth-FA-DHA w/o A (PRENATE PIXIE) 10-0.6-0.4-200 MG CAPS Take 1 capsule by mouth daily. 04/01/21   Shelly Bombard, MD    Family History Family History  Problem Relation Age of Onset   Healthy Mother    Hypertension Father     Social History Social History   Tobacco Use   Smoking status: Never   Smokeless tobacco: Never  Vaping Use   Vaping Use: Never used  Substance Use Topics   Alcohol use: Not Currently    Comment: not since confirmed pregnancy   Drug use: No     Allergies   Patient has no known allergies.   Review of Systems Review of Systems Per HPI  Physical Exam Triage Vital Signs ED Triage Vitals  Enc Vitals Group     BP 09/27/22 0846 126/86     Pulse Rate 09/27/22 0846 82     Resp 09/27/22 0846 17     Temp 09/27/22 0846 98.1 F (36.7 C)     Temp Source 09/27/22 0846 Oral  SpO2 09/27/22 0846 97 %     Weight --      Height --      Head Circumference --      Peak Flow --      Pain Score 09/27/22 0847 5     Pain Loc --      Pain Edu? --      Excl. in [place]? --    No data found.  Updated Vital Signs BP 126/86 (BP Location: Left Arm)   Pulse 82   Temp 98.1 F (36.7 C) (Oral)   Resp 17   LMP 09/03/2022 (Approximate)   SpO2 98%   Visual Acuity Right Eye Distance:   Left Eye Distance:   Bilateral Distance:    Right Eye Near:   Left Eye Near:    Bilateral Near:     Physical Exam Vitals and nursing note reviewed.  Constitutional:      Appearance: She is not ill-appearing or toxic-appearing.  HENT:     Head: Normocephalic and atraumatic.     Right Ear: Hearing and external ear normal.     Left Ear: Hearing and external ear normal.     Nose: Nose normal.     Mouth/Throat:     Lips: Pink.  Eyes:     General: Lids are normal. Vision grossly intact. Gaze aligned appropriately.      Extraocular Movements: Extraocular movements intact.     Conjunctiva/sclera: Conjunctivae normal.  Pulmonary:     Effort: Pulmonary effort is normal.  Abdominal:     General: Bowel sounds are normal.     Palpations: Abdomen is soft.     Tenderness: There is no abdominal tenderness. There is no right CVA tenderness, left CVA tenderness or guarding.  Genitourinary:    Comments: Deferred. Musculoskeletal:     Cervical back: Neck supple.  Skin:    General: Skin is warm and dry.     Capillary Refill: Capillary refill takes less than 2 seconds.     Findings: No rash.  Neurological:     General: No focal deficit present.     Mental Status: She is alert and oriented to person, place, and time. Mental status is at baseline.     Cranial Nerves: No dysarthria or facial asymmetry.  Psychiatric:        Mood and Affect: Mood normal.        Speech: Speech normal.        Behavior: Behavior normal.        Thought Content: Thought content normal.        Judgment: Judgment normal.      UC Treatments / Results  Labs (all labs ordered are listed, but only abnormal results are displayed) Labs Reviewed  POCT URINALYSIS DIPSTICK, ED / UC  POC URINE PREG, ED  CERVICOVAGINAL ANCILLARY ONLY    EKG   Radiology No results found.  Procedures Procedures (including critical care time)  Medications Ordered in UC Medications - No data to display  Initial Impression / Assessment and Plan / UC Course  I have reviewed the triage vital signs and the nursing notes.  Pertinent labs & imaging results that were available during my care of the patient were reviewed by me and considered in my medical decision making (see chart for details).   1.  Pelvic pain and screen for STD Pelvic pain is likely related to vaginal friction injury during recent sexual intercourse without extra lubrication.  Advised patient to place a thin layer  of Aquaphor to the vaginal area as needed.  Recommend purchasing a  water-soluble plain lubricant to use during sexual intercourse to prevent future friction injury/pelvic pain post coitus.  STD testing is pending and will come back in the next 2 to 3 days.  We will call patient with any abnormal results requiring further treatment.  She declines HIV and syphilis testing.  Advised to avoid sexual intercourse until STD testing comes back.  She may take Tylenol every 6 hours as needed for pelvic pain.  Urine pregnancy test is negative.   Discussed physical exam and available lab work findings in clinic with patient.  Counseled patient regarding appropriate use of medications and potential side effects for all medications recommended or prescribed today. Discussed red flag signs and symptoms of worsening condition,when to call the PCP office, return to urgent care, and when to seek higher level of care in the emergency department. Patient verbalizes understanding and agreement with plan. All questions answered. Patient discharged in stable condition.   Final Clinical Impressions(s) / UC Diagnoses   Final diagnoses:  Screening for STD (sexually transmitted disease)     Discharge Instructions      Your STD testing has been sent to the lab and will come back in the next 2 to 3 days.  We will call you if any of your results are positive requiring treatment and treat you at that time.   Avoid sexual intercourse until your STD results come back.  If any of your STD results are positive, you will need to avoid sexual intercourse for 7 days while you are being treated to prevent spread of STD.  Condom use is the best way to prevent spread of STDs.  Return to urgent care as needed.    ED Prescriptions   None    PDMP not reviewed this encounter.   Reita May [place], [place] 09/27/22 [phone]

## 2022-09-27 NOTE — ED Triage Notes (Signed)
Pt reports pelvic pain since Thursday. States pain began after having sexual intercourse. Pain worsens with urination and trying to have a BM. Requesting STI testing and pregnancy testing. LMP 09/03/2022.

## 2022-09-30 ENCOUNTER — Telehealth (HOSPITAL_COMMUNITY): Payer: Self-pay | Admitting: Emergency Medicine

## 2022-09-30 LAB — CERVICOVAGINAL ANCILLARY ONLY
Bacterial Vaginitis (gardnerella): POSITIVE — AB
Candida Glabrata: NEGATIVE
Candida Vaginitis: POSITIVE — AB
Chlamydia: NEGATIVE
Comment: NEGATIVE
Comment: NEGATIVE
Comment: NEGATIVE
Comment: NEGATIVE
Comment: NEGATIVE
Comment: NORMAL
Neisseria Gonorrhea: NEGATIVE
Trichomonas: NEGATIVE

## 2022-09-30 MED ORDER — METRONIDAZOLE 500 MG PO TABS: 500.0000 mg | ORAL_TABLET | Freq: Two times a day (BID) | ORAL | 0 refills | Status: DC

## 2022-09-30 MED ORDER — FLUCONAZOLE 150 MG PO TABS: 150.0000 mg | ORAL_TABLET | Freq: Once | ORAL | 0 refills | Status: AC

## 2022-10-15 ENCOUNTER — Other Ambulatory Visit (HOSPITAL_COMMUNITY)
Admission: RE | Admit: 2022-10-15 | Discharge: 2022-10-15 | Disposition: A | Discharge status: Home / Self Care | Payer: Medicaid Other | Source: Ambulatory Visit | Attending: Nurse Practitioner | Admitting: Nurse Practitioner

## 2022-10-15 ENCOUNTER — Other Ambulatory Visit: Payer: Self-pay | Admitting: Nurse Practitioner

## 2022-10-15 DIAGNOSIS — Z01419 Encounter for gynecological examination (general) (routine) without abnormal findings: Secondary | ICD-10-CM | POA: Insufficient documentation

## 2022-10-19 LAB — CYTOLOGY - PAP
Comment: NEGATIVE
Diagnosis: NEGATIVE
Diagnosis: REACTIVE
High risk HPV: NEGATIVE

## 2023-02-11 IMAGING — US US OB LIMITED
1 series · 2 of 2 positions shown · non-contrast
Comparison: none

[Series 1: us ob limited · 2 of 2 slices shown]
[im 1/2]
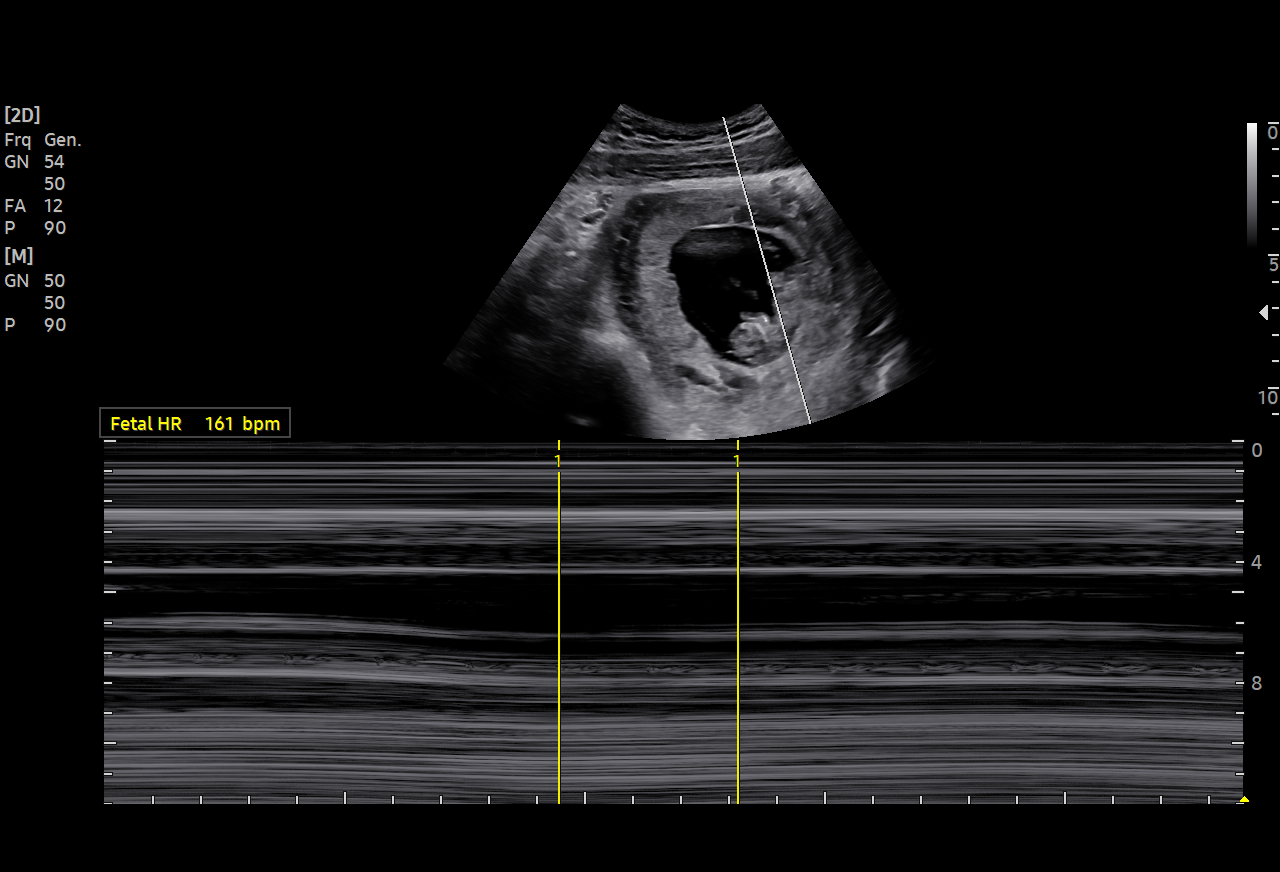
[im 2/2]
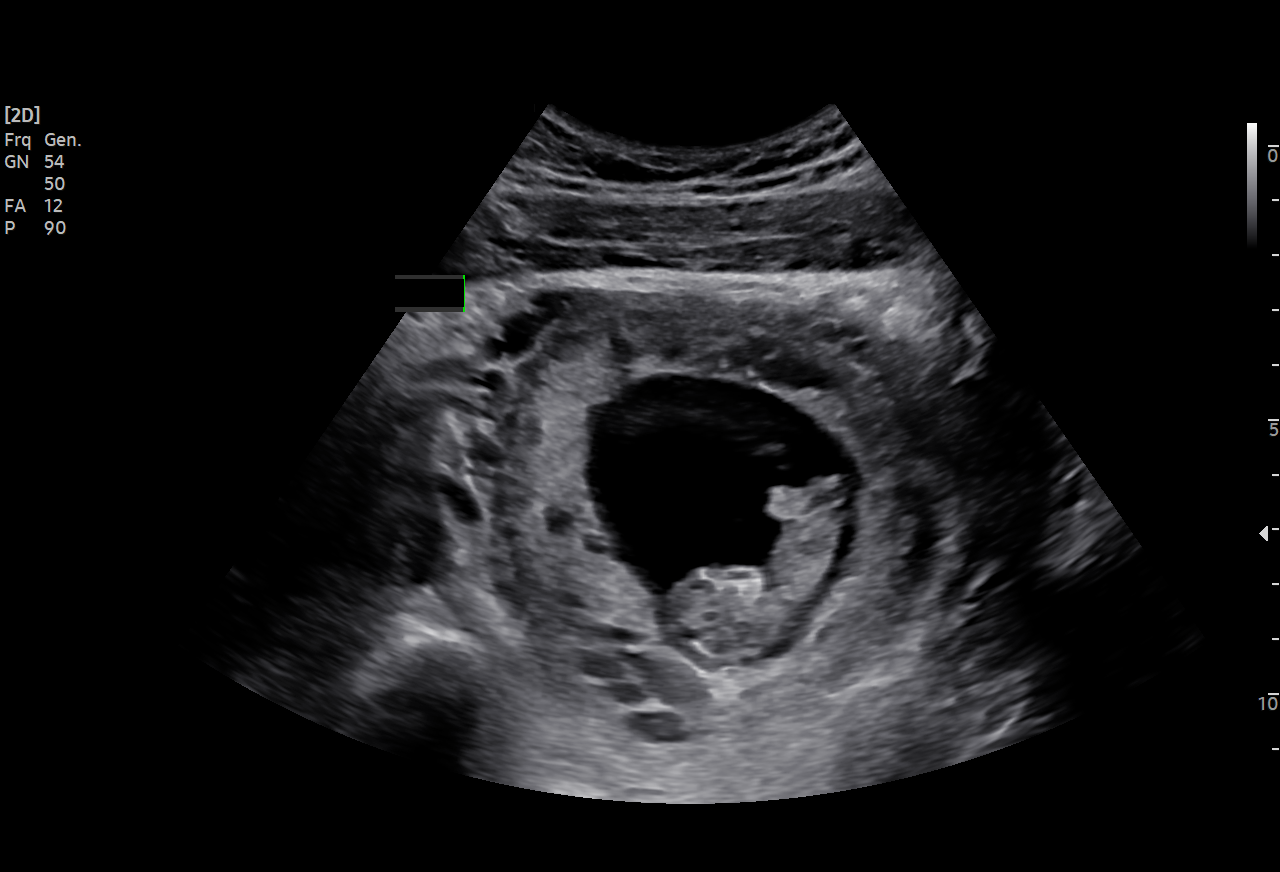

[2 of 2 positions shown; findings below may reference images not displayed]

Healthcare

 1   [HOSPITAL]                        [address].0      ANASTAZAJA MEINERT

Indications

 10 weeks gestation of pregnancy
 Unable to hear fetal heart tones as reason for  O76
 ultrasound
Fetal Evaluation

 Num Of Fetuses:          1
 Fetal Heart Rate(bpm):   161
 Cardiac Activity:        Observed

 Comment:     FHT 161
Comments

 Unable to heart heart tones with hand held doppler. FHR 161

## 2023-03-18 DIAGNOSIS — N760 Acute vaginitis: Secondary | ICD-10-CM | POA: Diagnosis not present

## 2023-03-18 DIAGNOSIS — Z113 Encounter for screening for infections with a predominantly sexual mode of transmission: Secondary | ICD-10-CM | POA: Diagnosis not present

## 2023-04-21 ENCOUNTER — Encounter (HOSPITAL_COMMUNITY): Payer: Self-pay | Admitting: Emergency Medicine

## 2023-04-21 ENCOUNTER — Ambulatory Visit (HOSPITAL_COMMUNITY)
Admission: EM | Admit: 2023-04-21 | Discharge: 2023-04-21 | Disposition: A | Discharge status: Home / Self Care | Payer: Medicaid Other | Attending: Sports Medicine | Admitting: Sports Medicine

## 2023-04-21 DIAGNOSIS — N912 Amenorrhea, unspecified: Secondary | ICD-10-CM | POA: Diagnosis not present

## 2023-04-21 DIAGNOSIS — Z3201 Encounter for pregnancy test, result positive: Secondary | ICD-10-CM

## 2023-04-21 LAB — POCT URINE PREGNANCY: Preg Test, Ur: POSITIVE — AB

## 2023-04-21 NOTE — ED Provider Notes (Signed)
[facility]    CSN: [id number] Arrival date & time: 04/21/23  0802      History   Chief Complaint Chief Complaint  Patient presents with   Possible Pregnancy    HPI Caroline Weber is a 37 y.o. female.   She is here today requesting pregnancy test.  She reports she took a positive pregnancy test at home and would like to confirm.  Her last menstrual period was in March.  She has had 4 other pregnancies, 3 spontaneous abortions and 1 medical abortion.  She denies any abdominal cramping, vaginal bleeding, pain or nausea.  She has a history of ovarian cysts.  She is not currently on any form of birth control or prenatal vitamin.   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 Authorizing Provider  ibuprofen (ADVIL) 600 MG tablet Take 1 tablet (600 mg total) by mouth every 6 (six) hours as needed. 08/02/22   Carlisle Beers, FNP  metroNIDAZOLE (FLAGYL) 500 MG tablet Take 1 tablet (500 mg total) by mouth 2 (two) times daily. 09/30/22   Lamptey, Britta Mccreedy, MD  Prenat-FeAsp-Meth-FA-DHA w/o A (PRENATE PIXIE) 10-0.6-0.4-200 MG CAPS Take 1 capsule by mouth daily. 04/01/21   Brock Bad, MD    Family History Family History  Problem Relation Age of Onset   Healthy Mother    Hypertension Father     Social History Social History   Tobacco Use    Smoking status: Never   Smokeless tobacco: Never  Vaping Use   Vaping Use: Never used  Substance Use Topics   Alcohol use: Not Currently    Comment: not since confirmed pregnancy   Drug use: No     Allergies   Patient has no known allergies.   Review of Systems Review of Systems as listed above in HPI   Physical Exam Triage Vital Signs ED Triage Vitals  Enc Vitals Group     BP 04/21/23 0836 (!) 130/97     Pulse Rate 04/21/23 0836 71     Resp 04/21/23 0836 17     Temp 04/21/23 0836 97.8 F (36.6 C)     Temp Source 04/21/23 0836 Oral     SpO2 04/21/23 0836 97 %     Weight --      Height --      Head Circumference --      Peak Flow --      Pain Score 04/21/23 0835 0     Pain Loc --  Pain Edu? --      Excl. in GC? --    No data found.  Updated Vital Signs BP (!) 130/97 (BP Location: Right Arm)   Pulse 71   Temp 97.8 F (36.6 C) (Oral)   Resp 17   LMP 03/01/2023   SpO2 97%   Physical Exam Vitals reviewed.  Constitutional:      General: She is not in acute distress.    Appearance: Normal appearance. She is not ill-appearing, toxic-appearing or diaphoretic.  HENT:     Head: Normocephalic.  Eyes:     Extraocular Movements: Extraocular movements intact.     Conjunctiva/sclera: Conjunctivae normal.  Cardiovascular:     Rate and Rhythm: Normal rate.  Pulmonary:     Effort: Pulmonary effort is normal.  Skin:    General: Skin is warm.  Neurological:     Mental Status: She is alert.  Psychiatric:        Mood and Affect: Mood normal.        Behavior: Behavior normal.        Thought Content: Thought content normal.        Judgment: Judgment normal.      UC Treatments / Results  Labs (all labs ordered are listed, but only abnormal results are displayed) Labs Reviewed  POCT URINE PREGNANCY - Abnormal; Notable for the following components:      Result Value   Preg Test, Ur Positive (*)    All other components within normal limits     EKG   Radiology No results found.  Procedures Procedures (including critical care time)  Medications Ordered in UC Medications - No data to display  Initial Impression / Assessment and Plan / UC Course  I have reviewed the triage vital signs and the nursing notes.  Pertinent labs & imaging results that were available during my care of the patient were reviewed by me and considered in my medical decision making (see chart for details).     Amenorrhea, last menstrual period 03/01/2023 UPT positive today I counseled patient on setting up an appointment with her PCP or OB/GYN.  Counseled her to avoid any prescription or over-the-counter medication before talking to her OB provider.  Encouraged her to eat a well-balanced diet, avoid alcohol and tobacco and increase her water intake.  She verbalized understanding.  I encouraged her to start a prenatal vitamin with folic acid today.  She will follow-up with an OB provider. Final Clinical Impressions(s) / UC Diagnoses   Final diagnoses:  Amenorrhea  Positive pregnancy test     Discharge Instructions      Your urine pregnancy test today was positive.  I recommend you follow-up with an OB/GYN.  Avoid any medications including over-the-counter medications without checking with your OB/GYN provider.  Begin a prenatal vitamin today make sure you are getting adequate water and appropriate meals.  You only require 100 extra calories a day when you are pregnant.   ED Prescriptions   None    PDMP not reviewed this encounter.   Claudie Leach, DO 04/21/23 [phone]

## 2023-04-21 NOTE — Discharge Instructions (Signed)
Your urine pregnancy test today was positive.  I recommend you follow-up with an OB/GYN.  Avoid any medications including over-the-counter medications without checking with your OB/GYN provider.  Begin a prenatal vitamin today make sure you are getting adequate water and appropriate meals.  You only require 100 extra calories a day when you are pregnant.

## 2023-04-21 NOTE — ED Triage Notes (Signed)
Pt had positive home pregnancy test. Wanting confirmation. LMP end of March.

## 2023-04-26 ENCOUNTER — Emergency Department (HOSPITAL_COMMUNITY): Payer: BLUE CROSS/BLUE SHIELD

## 2023-04-26 ENCOUNTER — Other Ambulatory Visit: Payer: Self-pay

## 2023-04-26 ENCOUNTER — Emergency Department (HOSPITAL_COMMUNITY)
Admission: EM | Admit: 2023-04-26 | Discharge: 2023-04-26 | Disposition: A | Discharge status: Home / Self Care | Payer: BLUE CROSS/BLUE SHIELD | Attending: Emergency Medicine | Admitting: Emergency Medicine

## 2023-04-26 ENCOUNTER — Encounter (HOSPITAL_COMMUNITY): Payer: Self-pay

## 2023-04-26 DIAGNOSIS — M25532 Pain in left wrist: Secondary | ICD-10-CM | POA: Diagnosis not present

## 2023-04-26 DIAGNOSIS — R2232 Localized swelling, mass and lump, left upper limb: Secondary | ICD-10-CM | POA: Insufficient documentation

## 2023-04-26 DIAGNOSIS — M79645 Pain in left finger(s): Secondary | ICD-10-CM | POA: Diagnosis not present

## 2023-04-26 DIAGNOSIS — Y92410 Unspecified street and highway as the place of occurrence of the external cause: Secondary | ICD-10-CM | POA: Diagnosis not present

## 2023-04-26 DIAGNOSIS — M542 Cervicalgia: Secondary | ICD-10-CM | POA: Diagnosis not present

## 2023-04-26 DIAGNOSIS — M79642 Pain in left hand: Secondary | ICD-10-CM | POA: Diagnosis not present

## 2023-04-26 MED ORDER — LIDOCAINE 5 % EX PTCH: 1.0000 | MEDICATED_PATCH | CUTANEOUS | 0 refills | Status: DC

## 2023-04-26 MED ORDER — IBUPROFEN 400 MG PO TABS
600.0000 mg | ORAL_TABLET | Freq: Once | ORAL | Status: AC
  Administered 2023-04-26: 600 mg via ORAL
  Filled 2023-04-26: qty 1

## 2023-04-26 MED ORDER — ACETAMINOPHEN 500 MG PO TABS: 500.0000 mg | ORAL_TABLET | Freq: Four times a day (QID) | ORAL | 0 refills | Status: DC | PRN

## 2023-04-26 MED ORDER — IBUPROFEN 600 MG PO TABS: 600.0000 mg | ORAL_TABLET | Freq: Four times a day (QID) | ORAL | 0 refills | Status: DC | PRN

## 2023-04-26 MED ORDER — METHOCARBAMOL 500 MG PO TABS: 500.0000 mg | ORAL_TABLET | Freq: Two times a day (BID) | ORAL | 0 refills | Status: DC

## 2023-04-26 MED ORDER — LIDOCAINE 5 % EX PTCH
1.0000 | MEDICATED_PATCH | CUTANEOUS | Status: DC
  Administered 2023-04-26: 1 via TRANSDERMAL
  Filled 2023-04-26: qty 1
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
## 2023-04-26 NOTE — ED Notes (Signed)
Reviewed discharge instructions with patient. Follow-up care and medications reviewed. Patient  verbalized understanding. Patient A&Ox4, VSS, and ambulatory with steady gait upon discharge.  °
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
## 2023-05-11 ENCOUNTER — Ambulatory Visit (HOSPITAL_COMMUNITY)
Admission: EM | Admit: 2023-05-11 | Discharge: 2023-05-11 | Disposition: A | Discharge status: Home / Self Care | Payer: BC Managed Care – PPO | Attending: Physician Assistant | Admitting: Physician Assistant

## 2023-05-11 ENCOUNTER — Encounter (HOSPITAL_COMMUNITY): Payer: Self-pay

## 2023-05-11 DIAGNOSIS — Z113 Encounter for screening for infections with a predominantly sexual mode of transmission: Secondary | ICD-10-CM | POA: Diagnosis present

## 2023-05-11 DIAGNOSIS — Z3202 Encounter for pregnancy test, result negative: Secondary | ICD-10-CM | POA: Diagnosis present

## 2023-05-11 DIAGNOSIS — N898 Other specified noninflammatory disorders of vagina: Secondary | ICD-10-CM | POA: Insufficient documentation

## 2023-05-11 LAB — POCT URINE PREGNANCY: Preg Test, Ur: NEGATIVE

## 2023-05-11 MED ORDER — METRONIDAZOLE 500 MG PO TABS: 500.0000 mg | ORAL_TABLET | Freq: Two times a day (BID) | ORAL | 0 refills | Status: DC

## 2023-05-11 NOTE — Discharge Instructions (Signed)
we are treating you for bacterial vaginosis.  Please take metronidazole twice daily for 7 days.  Do not drink any alcohol while on this medication as will cause you to vomit.  Wear loosefitting cotton underwear and use hypoallergenic soaps and detergents.  If need to arrange additional treatment based on your swab results we will contact you.  Monitor your MyChart for these results.  If you develop any worsening symptoms including abdominal pain, pelvic pain, fever, nausea, vomiting you need to be seen immediately.  Your urine pregnancy test was negative.
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
## 2023-05-12 ENCOUNTER — Telehealth (HOSPITAL_COMMUNITY): Payer: Self-pay | Admitting: Emergency Medicine

## 2023-05-12 LAB — CERVICOVAGINAL ANCILLARY ONLY
Bacterial Vaginitis (gardnerella): POSITIVE — AB
Candida Glabrata: NEGATIVE
Candida Vaginitis: NEGATIVE
Chlamydia: NEGATIVE
Comment: NEGATIVE
Comment: NEGATIVE
Comment: NEGATIVE
Comment: NEGATIVE
Comment: NEGATIVE
Comment: NORMAL
Neisseria Gonorrhea: NEGATIVE
Trichomonas: NEGATIVE

## 2023-05-12 NOTE — Telephone Encounter (Signed)
Opened in error

## 2023-05-30 ENCOUNTER — Encounter (HOSPITAL_COMMUNITY): Payer: Self-pay | Admitting: Emergency Medicine

## 2023-05-30 ENCOUNTER — Other Ambulatory Visit: Payer: Self-pay

## 2023-05-30 ENCOUNTER — Ambulatory Visit (HOSPITAL_COMMUNITY)
Admission: EM | Admit: 2023-05-30 | Discharge: 2023-05-30 | Disposition: A | Discharge status: Home / Self Care | Payer: BC Managed Care – PPO | Attending: Physician Assistant | Admitting: Physician Assistant

## 2023-05-30 DIAGNOSIS — Z113 Encounter for screening for infections with a predominantly sexual mode of transmission: Secondary | ICD-10-CM | POA: Diagnosis present

## 2023-05-30 DIAGNOSIS — N898 Other specified noninflammatory disorders of vagina: Secondary | ICD-10-CM | POA: Diagnosis present

## 2023-05-30 LAB — HIV ANTIBODY (ROUTINE TESTING W REFLEX): HIV Screen 4th Generation wRfx: NONREACTIVE

## 2023-05-30 LAB — RPR: RPR Ser Ql: NONREACTIVE

## 2023-05-30 MED ORDER — METRONIDAZOLE 0.75 % VA GEL: 1.0000 | Freq: Every day | VAGINAL | 0 refills | Status: DC

## 2023-05-30 NOTE — ED Triage Notes (Signed)
Reports vaginal discharge and irritation.  Patient is certain it is BV and reports this happens frequently after being with her partner.

## 2023-05-30 NOTE — ED Provider Notes (Signed)
[facility]    CSN: [id number] Arrival date & time: 05/30/23  0807      History   Chief Complaint Chief Complaint  Patient presents with   SEXUALLY TRANSMITTED DISEASE    HPI CHLOEY RICARD is a 37 y.o. female.   Patient presents today with a several day history of vaginal irritation and discharge.  She describes this as copious with a slight odor.  She has a history of recurrent bacterial vaginosis with similar presentation.  She is not currently followed by OB/GYN and has never taken medication to manage recurrent BV.  She has been sexually active with female partner and is interested in complete STI panel.  She has no specific concern for STI.  Denies any pelvic pain, abdominal pain, fever, nausea, vomiting.  She is confident that she is not pregnant.  She was last seen 05/11/2023 with similar symptoms and treated with metronidazole.  She is requesting MetroGel rather than oral metronidazole if appropriate today.  She denies any changes to personal hygiene products including soaps, detergents.    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 Authorizing Provider  metroNIDAZOLE (METROGEL) 0.75 % vaginal gel Place 1 Applicatorful vaginally at bedtime. 05/30/23  Yes Rykin Route, Noberto Retort, PA-C  acetaminophen (TYLENOL) 500 MG tablet Take 1 tablet (500 mg total) by mouth every 6 (six) hours  as needed. 04/26/23   Redwine, Madison A, PA-C  ibuprofen (ADVIL) 600 MG tablet Take 1 tablet (600 mg total) by mouth every 6 (six) hours as needed. 04/26/23   Redwine, Madison A, PA-C    Family History Family History  Problem Relation Age of Onset   Healthy Mother    Hypertension Father     Social History Social History   Tobacco Use   Smoking status: Never   Smokeless tobacco: Never  Vaping Use   Vaping Use: Never used  Substance Use Topics   Alcohol use: Yes   Drug use: No     Allergies   Patient has no known allergies.   Review of Systems Review of Systems  Constitutional:  Negative for activity change, appetite change, fatigue and fever.  Gastrointestinal:  Negative for abdominal pain, diarrhea, nausea and vomiting.  Genitourinary:  Positive for vaginal discharge. Negative for dysuria, frequency, pelvic pain, urgency, vaginal bleeding and vaginal pain.     Physical Exam Triage Vital Signs ED Triage Vitals  Enc Vitals Group     BP 05/30/23 0842 126/89     Pulse Rate 05/30/23 0842 76     Resp 05/30/23 0842  20     Temp 05/30/23 0842 98.7 F (37.1 C)     Temp Source 05/30/23 0842 Oral     SpO2 05/30/23 0842 97 %     Weight --      Height --      Head Circumference --      Peak Flow --      Pain Score 05/30/23 0839 0     Pain Loc --      Pain Edu? --      Excl. in GC? --    No data found.  Updated Vital Signs BP 126/89 (BP Location: Left Arm)   Pulse 76   Temp 98.7 F (37.1 C) (Oral)   Resp 20   LMP 05/16/2023   SpO2 97%   Visual Acuity Right Eye Distance:   Left Eye Distance:   Bilateral Distance:    Right Eye Near:   Left Eye Near:    Bilateral Near:     Physical Exam Vitals reviewed.  Constitutional:      General: She is awake. She is not in acute distress.    Appearance: Normal appearance. She is well-developed. She is not ill-appearing.     Comments: Very pleasant female appears stated age in no acute distress sitting comfortably in  exam room  HENT:     Head: Normocephalic and atraumatic.  Cardiovascular:     Rate and Rhythm: Normal rate and regular rhythm.     Heart sounds: Normal heart sounds, S1 normal and S2 normal. No murmur heard. Pulmonary:     Effort: Pulmonary effort is normal.     Breath sounds: Normal breath sounds. No wheezing, rhonchi or rales.     Comments: Clear to auscultation bilaterally Abdominal:     General: Bowel sounds are normal.     Palpations: Abdomen is soft.     Tenderness: There is no abdominal tenderness. There is no right CVA tenderness, left CVA tenderness, guarding or rebound.     Comments: Benign abdominal exam  Psychiatric:        Behavior: Behavior is cooperative.      UC Treatments / Results  Labs (all labs ordered are listed, but only abnormal results are displayed) Labs Reviewed  RPR  HIV ANTIBODY (ROUTINE TESTING W REFLEX)  CERVICOVAGINAL ANCILLARY ONLY    EKG   Radiology No results found.  Procedures Procedures (including critical care time)  Medications Ordered in UC Medications - No data to display  Initial Impression / Assessment and Plan / UC Course  I have reviewed the triage vital signs and the nursing notes.  Pertinent labs & imaging results that were available during my care of the patient were reviewed by me and considered in my medical decision making (see chart for details).      Patient is well-appearing, afebrile, nontoxic, nontachycardic.  Vital signs and physical exam are reassuring today with no indication for emergent evaluation or imaging.  She has no concern for pregnancy and denies any UTI symptoms.  Concern for recurrent bacterial vaginosis given clinical presentation.  Patient requested treatment with metronidazole and so this was sent to pharmacy.  We discussed that some of this is still absorbed into the body and so it is important she continue to avoid alcohol due to associated Antabuse side effects.  Given her recurrent symptoms I  did recommend she follow-up with OB/GYN to consider treatment for recurrent BV and was given contact information for local provider.  She did request STI testing  so this was obtained including blood testing for HIV and syphilis.  She has had negative hepatitis C testing in 2017.  We discussed that if she has any worsening or changing symptoms including abdominal pain, pelvic pain, fever, nausea, vomiting she should be seen emergently.  Strict return precautions given.   Final Clinical Impressions(s) / UC Diagnoses   Final diagnoses:  Vaginal discharge  Vaginal irritation  Screening examination for STI     Discharge Instructions      We are treating you for bacterial vaginosis.  Use 1 applicatorful of metronidazole before bed for 1 week.  This is still absorbed into your body so it is important that you avoid any alcohol while on this medication as it will cause you to vomit.  Wear loosefitting cotton underwear and use hypoallergenic soaps and detergents.  Monitor your MyChart for results.  I think is reasonable follow-up with an OB/GYN.  Please call and schedule an appointment.  If you have any worsening symptoms including abdominal pain, pelvic pain, fever, nausea, vomiting you need to be seen immediately.     ED Prescriptions     Medication Sig Dispense Auth. Provider   metroNIDAZOLE (METROGEL) 0.75 % vaginal gel Place 1 Applicatorful vaginally at bedtime. 70 g Devron Cohick, Noberto Retort, PA-C      PDMP not reviewed this encounter.   Jeani Hawking, PA-C 05/30/23 1610

## 2023-05-30 NOTE — Discharge Instructions (Signed)
We are treating you for bacterial vaginosis.  Use 1 applicatorful of metronidazole before bed for 1 week.  This is still absorbed into your body so it is important that you avoid any alcohol while on this medication as it will cause you to vomit.  Wear loosefitting cotton underwear and use hypoallergenic soaps and detergents.  Monitor your MyChart for results.  I think is reasonable follow-up with an OB/GYN.  Please call and schedule an appointment.  If you have any worsening symptoms including abdominal pain, pelvic pain, fever, nausea, vomiting you need to be seen immediately.

## 2023-05-30 NOTE — ED Notes (Signed)
Patient was on phone while providing instructions.  Patient states understanding of instructions.

## 2023-05-31 LAB — CERVICOVAGINAL ANCILLARY ONLY
Bacterial Vaginitis (gardnerella): NEGATIVE
Candida Glabrata: NEGATIVE
Candida Vaginitis: POSITIVE — AB
Chlamydia: NEGATIVE
Comment: NEGATIVE
Comment: NEGATIVE
Comment: NEGATIVE
Comment: NEGATIVE
Comment: NEGATIVE
Comment: NORMAL
Neisseria Gonorrhea: NEGATIVE
Trichomonas: NEGATIVE

## 2023-06-01 ENCOUNTER — Telehealth: Payer: BC Managed Care – PPO | Admitting: Physician Assistant

## 2023-06-01 DIAGNOSIS — B3731 Acute candidiasis of vulva and vagina: Secondary | ICD-10-CM | POA: Diagnosis not present

## 2023-06-01 MED ORDER — FLUCONAZOLE 150 MG PO TABS
150.0000 mg | ORAL_TABLET | ORAL | 0 refills | Status: DC | PRN
Start: 2023-06-01 — End: 2024-01-20

## 2023-06-01 NOTE — Patient Instructions (Signed)
Caroline Weber, thank you for joining Margaretann Loveless, PA-C for today's virtual visit.  While this provider is not your primary care provider (PCP), if your PCP is located in our provider database this encounter information will be shared with them immediately following your visit.   A [facility] MyChart account gives you access to today's visit and all your visits, tests, and labs performed at [facility] " click here if you don't have a [facility] MyChart account or go to [email or web address]  Consent: (Patient) Caroline Weber provided verbal consent for this virtual visit at the beginning of the encounter.  Current Medications:  Current Outpatient Medications:    acetaminophen (TYLENOL) 500 MG tablet, Take 1 tablet (500 mg total) by mouth every 6 (six) hours as needed., Disp: 30 tablet, Rfl: 0   fluconazole (DIFLUCAN) 150 MG tablet, Take 1 tablet (150 mg total) by mouth every 3 (three) days as needed., Disp: 2 tablet, Rfl: 0   ibuprofen (ADVIL) 600 MG tablet, Take 1 tablet (600 mg total) by mouth every 6 (six) hours as needed., Disp: 30 tablet, Rfl: 0   metroNIDAZOLE (METROGEL) 0.75 % vaginal gel, Place 1 Applicatorful vaginally at bedtime., Disp: 70 g, Rfl: 0   Medications ordered in this encounter:  Meds ordered this encounter  Medications   fluconazole (DIFLUCAN) 150 MG tablet    Sig: Take 1 tablet (150 mg total) by mouth every 3 (three) days as needed.    Dispense:  2 tablet    Refill:  0    Order Specific Question:   Supervising Provider    Answer:   Merrilee Jansky [id number]     *If you need refills on other medications prior to your next appointment, please contact your pharmacy*  Follow-Up: Call back or seek an in-person evaluation if the symptoms worsen or if the condition fails to improve as anticipated.  [facility] [phone]  Other Instructions Vaginal Yeast Infection, Adult  Vaginal yeast infection is a condition  that causes vaginal discharge as well as soreness, swelling, and redness (inflammation) of the vagina. This is a common condition. Some women get this infection frequently. What are the causes? This condition is caused by a change in the normal balance of the yeast (Candida) and normal bacteria that live in the vagina. This change causes an overgrowth of yeast, which causes the inflammation. What increases the risk? The condition is more likely to develop in women who: Take antibiotic medicines. Have diabetes. Take birth control pills. Are pregnant. Douche often. Have a weak body defense system (immune system). Have been taking steroid medicines for a long time. Frequently wear tight clothing. What are the signs or symptoms? Symptoms of this condition include: White, thick, creamy vaginal discharge. Swelling, itching, redness, and irritation of the vagina. The lips of the vagina (labia) may be affected as well. Pain or a burning feeling while urinating. Pain during sex. How is this diagnosed? This condition is diagnosed based on: Your medical history. A physical exam. A pelvic exam. Your health care provider will examine a sample of your vaginal discharge under a microscope. Your health care provider may send this sample for testing to confirm the diagnosis. How is this treated? This condition is treated with medicine. Medicines may be over-the-counter or prescription. You may be told to use one or more of the following: Medicine that is taken by mouth (orally). Medicine that is applied as a cream (topically). Medicine that is inserted directly  into the vagina (suppository). Follow these instructions at home: Take or apply over-the-counter and prescription medicines only as told by your health care provider. Do not use tampons until your health care provider approves. Do not have sex until your infection has cleared. Sex can prolong or worsen your symptoms of infection. Ask your  health care provider when it is safe to resume sexual activity. Keep all follow-up visits. This is important. How is this prevented?  Do not wear tight clothes, such as pantyhose or tight pants. Wear breathable cotton underwear. Do not use douches, perfumed soap, creams, or powders. Wipe from front to back after using the toilet. If you have diabetes, keep your blood sugar levels under control. Ask your health care provider for other ways to prevent yeast infections. Contact a health care provider if: You have a fever. Your symptoms go away and then return. Your symptoms do not get better with treatment. Your symptoms get worse. You have new symptoms. You develop blisters in or around your vagina. You have blood coming from your vagina and it is not your menstrual period. You develop pain in your abdomen. Summary Vaginal yeast infection is a condition that causes discharge as well as soreness, swelling, and redness (inflammation) of the vagina. This condition is treated with medicine. Medicines may be over-the-counter or prescription. Take or apply over-the-counter and prescription medicines only as told by your health care provider. Do not douche. Resume sexual activity or use of tampons as instructed by your health care provider. Contact a health care provider if your symptoms do not get better with treatment or your symptoms go away and then return. This information is not intended to replace advice given to you by your health care provider. Make sure you discuss any questions you have with your health care provider. Document Revised: 02/09/2021 Document Reviewed: 02/09/2021 Elsevier Patient Education  2024 Elsevier Inc.    If you have been instructed to have an in-person evaluation today at a local Urgent Care facility, please use the link below. It will take you to a list of all of our available [facility], including address, phone number and hours of operation.  Please do not delay care.  [facility]  If you or a family member do not have a primary care provider, use the link below to schedule a visit and establish care. When you choose a [facility] primary care physician or advanced practice provider, you gain a long-term partner in health. Find a Primary Care Provider  Learn more about [facility]'s in-office and virtual care options: [facility] - Get Care Now

## 2023-06-01 NOTE — Progress Notes (Signed)
Virtual Visit Consent   NAVAYA WIATREK, you are scheduled for a virtual visit with a  provider today. Just as with appointments in the office, your consent must be obtained to participate. Your consent will be active for this visit and any virtual visit you may have with one of our providers in the next 365 days. If you have a MyChart account, a copy of this consent can be sent to you electronically.  As this is a virtual visit, video technology does not allow for your provider to perform a traditional examination. This may limit your provider's ability to fully assess your condition. If your provider identifies any concerns that need to be evaluated in person or the need to arrange testing (such as labs, EKG, etc.), we will make arrangements to do so. Although advances in technology are sophisticated, we cannot ensure that it will always work on either your end or our end. If the connection with a video visit is poor, the visit may have to be switched to a telephone visit. With either a video or telephone visit, we are not always able to ensure that we have a secure connection.  By engaging in this virtual visit, you consent to the provision of healthcare and authorize for your insurance to be billed (if applicable) for the services provided during this visit. Depending on your insurance coverage, you may receive a charge related to this service.  I need to obtain your verbal consent now. Are you willing to proceed with your visit today? Caroline Weber has provided verbal consent on 06/01/2023 for a virtual visit (video or telephone). Margaretann Loveless, PA-C  Date: 06/01/2023 1:04 PM  Virtual Visit via Video Note   I, Margaretann Loveless, connected with  Caroline Weber  ([id number], 22-May-1986) on 06/01/23 at  1:00 PM EDT by a video-enabled telemedicine application and verified that I am speaking with the correct person using two identifiers.  Location: Patient: Virtual Visit  Location Patient: Home Provider: Virtual Visit Location Provider: Home Office   I discussed the limitations of evaluation and management by telemedicine and the availability of in person appointments. The patient expressed understanding and agreed to proceed.    History of Present Illness: Caroline Weber is a 37 y.o. who identifies as a female who was assigned female at birth, and is being seen today for vaginal yeast infection. Was seen at [facility] on 05/30/23. Had felt was BV and had metrogel prescribed. Results from testing came back today and was positive for yeast, not BV. Requesting treatment.    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    Allergies: No Known Allergies Medications:  Current Outpatient Medications:    acetaminophen (TYLENOL) 500 MG tablet, Take 1 tablet (500 mg total) by mouth every 6 (six) hours as needed., Disp: 30 tablet, Rfl: 0   fluconazole (DIFLUCAN) 150 MG tablet, Take 1 tablet (150 mg total) by mouth every 3 (three) days as needed., Disp: 2 tablet, Rfl: 0   ibuprofen (ADVIL) 600 MG tablet, Take 1 tablet (600 mg total) by mouth every 6 (six) hours as needed., Disp: 30 tablet, Rfl: 0   metroNIDAZOLE (METROGEL) 0.75 % vaginal gel, Place 1 Applicatorful vaginally at bedtime., Disp: 70 g, Rfl: 0  Observations/Objective: Patient is well-developed, well-nourished in no acute distress.  Resting comfortably at home.  Head is normocephalic, atraumatic.  No labored breathing.  Speech is clear and  coherent with logical content.  Patient is alert and oriented at baseline.    Assessment and Plan: 1. Yeast vaginitis - fluconazole (DIFLUCAN) 150 MG tablet; Take 1 tablet (150 mg total) by mouth every 3 (three) days as needed.  Dispense: 2 tablet; Refill: 0  - Symptoms consistent with yeast vaginitis - Fluconazole prescribed - Limit bubble baths, scented lotions/soaps/detergents - Limit tight fitting  clothing - Seek on person evaluation if not improving or if symptoms worsen   Follow Up Instructions: I discussed the assessment and treatment plan with the patient. The patient was provided an opportunity to ask questions and all were answered. The patient agreed with the plan and demonstrated an understanding of the instructions.  A copy of instructions were sent to the patient via MyChart unless otherwise noted below.    The patient was advised to call back or seek an in-person evaluation if the symptoms worsen or if the condition fails to improve as anticipated.  Time:  I spent 8 minutes with the patient via telehealth technology discussing the above problems/concerns.    Margaretann Loveless, PA-C

## 2023-08-10 ENCOUNTER — Encounter (HOSPITAL_COMMUNITY): Payer: Self-pay | Admitting: *Deleted

## 2023-08-10 ENCOUNTER — Ambulatory Visit (HOSPITAL_COMMUNITY)
Admission: EM | Admit: 2023-08-10 | Discharge: 2023-08-10 | Disposition: A | Discharge status: Home / Self Care | Payer: BC Managed Care – PPO | Attending: Emergency Medicine | Admitting: Emergency Medicine

## 2023-08-10 ENCOUNTER — Other Ambulatory Visit: Payer: Self-pay

## 2023-08-10 DIAGNOSIS — N898 Other specified noninflammatory disorders of vagina: Secondary | ICD-10-CM | POA: Diagnosis present

## 2023-08-10 DIAGNOSIS — Z113 Encounter for screening for infections with a predominantly sexual mode of transmission: Secondary | ICD-10-CM | POA: Diagnosis not present

## 2023-08-10 LAB — HIV ANTIBODY (ROUTINE TESTING W REFLEX): HIV Screen 4th Generation wRfx: NONREACTIVE

## 2023-08-10 MED ORDER — METRONIDAZOLE 500 MG PO TABS: 500.0000 mg | ORAL_TABLET | Freq: Two times a day (BID) | ORAL | 0 refills | Status: DC

## 2023-08-10 NOTE — Discharge Instructions (Addendum)
Today you are being treated prophylactically for  Bacterial vaginosis  ? ?Take Metronidazole 500 mg twice a day for 7 days, do not drink alcohol while using medication, this will make you feel sick  ? ?Bacterial vaginosis which results from an overgrowth of one on several organisms that are normally present in your vagina. Vaginosis is an inflammation of the vagina that can result in discharge, itching and pain. ? ?Labs pending 2-3 days, you will be contacted if positive for any sti and treatment will be sent to the pharmacy, you will have to return to the clinic if positive for gonorrhea to receive treatment  ? ?Please refrain from having sex until labs results, if positive please refrain from having sex until treatment complete and symptoms resolve  ? ?If positive for HIV, Syphilis, Chlamydia  gonorrhea or trichomoniasis please notify partner or partners so they may tested as well ? ?Moving forward, it is recommended you use some form of protection against the transmission of sti infections  such as condoms or dental dams with each sexual encounter   ? ? ?In addition: ?Avoid baths, hot tubs and whirlpool spas.  ?Don't use scented or harsh soaps ?Avoid irritants. These include scented tampons and pads. ?Wipe from front to back after using the toilet. ?Don't douche. Your vagina doesn't require cleansing other than normal bathing.  ?Use a condom.  ?Wear cotton underwear, this fabric absorbs some moisture.  ? ? ?  ?

## 2023-08-10 NOTE — ED Provider Notes (Signed)
[facility]    CSN: [id number] Arrival date & time: 08/10/23  0802      History   Chief Complaint Chief Complaint  Patient presents with   SEXUALLY TRANSMITTED DISEASE   Vaginal Discharge    HPI Caroline Weber is a 37 y.o. female.   Patient presents for evaluation of Marcial Pless vaginal discharge beginning 1 day ago.  Endorses reoccurring history of bacterial vaginosis.  Last sexual encounter 4 days ago, 1 partner, no known exposure, no concern for STD.  Last menstrual period 07/31/2023, no concern for pregnancy.  Denies vaginal itching, irritation or odor, abdominal pain, flank pain, urinary symptoms.    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 Authorizing Provider  acetaminophen (TYLENOL) 500 MG tablet Take 1 tablet (500 mg total) by mouth every 6 (six) hours as needed. 04/26/23   Redwine, Madison A, PA-C  fluconazole (DIFLUCAN) 150 MG tablet Take 1 tablet (150 mg total) by mouth every 3 (three) days as needed. 06/01/23   Margaretann Loveless, PA-C  ibuprofen (ADVIL) 600 MG tablet Take 1 tablet (600 mg total) by mouth every 6 (six) hours as needed. 04/26/23   Redwine, Madison A, PA-C  metroNIDAZOLE (METROGEL) 0.75 % vaginal gel Place 1 Applicatorful vaginally at bedtime. 05/30/23   Raspet, Noberto Retort, PA-C    Family History Family History  Problem Relation Age of Onset   Healthy  Mother    Hypertension Father     Social History Social History   Tobacco Use   Smoking status: Never   Smokeless tobacco: Never  Vaping Use   Vaping status: Never Used  Substance Use Topics   Alcohol use: Yes   Drug use: No     Allergies   Patient has no known allergies.   Review of Systems Review of Systems  Constitutional: Negative.   HENT: Negative.    Genitourinary:  Positive for vaginal discharge. Negative for decreased urine volume, difficulty urinating, dyspareunia, dysuria, enuresis, flank pain, frequency, genital sores, hematuria, menstrual problem, pelvic pain, urgency, vaginal bleeding and vaginal pain.  Skin: Negative.   Neurological: Negative.      Physical Exam Triage Vital Signs ED Triage Vitals  Encounter Vitals Group     BP 08/10/23 0819 127/88     Systolic BP Percentile --      Diastolic BP Percentile --      Pulse Rate 08/10/23 0819 67     Resp 08/10/23 0819 16     Temp 08/10/23 0819 98.5 F (36.9 C)     Temp src --  SpO2 08/10/23 0819 98 %     Weight --      Height --      Head Circumference --      Peak Flow --      Pain Score 08/10/23 0817 0     Pain Loc --      Pain Education --      Exclude from Growth Chart --    No data found.  Updated Vital Signs BP 127/88   Pulse 67   Temp 98.5 F (36.9 C)   Resp 16   LMP 07/31/2023   SpO2 98%   Visual Acuity Right Eye Distance:   Left Eye Distance:   Bilateral Distance:    Right Eye Near:   Left Eye Near:    Bilateral Near:     Physical Exam Constitutional:      Appearance: Normal appearance.  Eyes:     Extraocular Movements: Extraocular movements intact.  Pulmonary:     Effort: Pulmonary effort is normal.  Genitourinary:    Comments: deferred Neurological:     Mental Status: She is alert and oriented to person, place, and time. Mental status is at baseline.      UC Treatments / Results  Labs (all labs ordered are listed, but only abnormal results are  displayed) Labs Reviewed  CERVICOVAGINAL ANCILLARY ONLY    EKG   Radiology No results found.  Procedures Procedures (including critical care time)  Medications Ordered in UC Medications - No data to display  Initial Impression / Assessment and Plan / UC Course  I have reviewed the triage vital signs and the nursing notes.  Pertinent labs & imaging results that were available during my care of the patient were reviewed by me and considered in my medical decision making (see chart for details).  Vaginal discharge  Treating prophylactically for bacterial vaginosis based on history, MetroGel sent to pharmacy and discussed administration, STI labs pending will treat per protocol, advised abstinence until lab results, and/or treatment is complete, advised condom use during all sexual encounters moving, may follow-up with urgent care as needed  Final Clinical Impressions(s) / UC Diagnoses   Final diagnoses:  None   Discharge Instructions   None    ED Prescriptions   None    PDMP not reviewed this encounter.   Valinda Hoar, [place] 08/10/23 [phone]

## 2023-08-10 NOTE — ED Triage Notes (Signed)
Pt requesting STI screening . Pt has a light Vag discharge.

## 2023-08-11 LAB — CERVICOVAGINAL ANCILLARY ONLY
Bacterial Vaginitis (gardnerella): POSITIVE — AB
Candida Glabrata: NEGATIVE
Candida Vaginitis: NEGATIVE
Chlamydia: NEGATIVE
Comment: NEGATIVE
Comment: NEGATIVE
Comment: NEGATIVE
Comment: NEGATIVE
Comment: NEGATIVE
Comment: NORMAL
Neisseria Gonorrhea: NEGATIVE
Trichomonas: NEGATIVE

## 2023-08-11 LAB — RPR: RPR Ser Ql: NONREACTIVE

## 2023-09-16 IMAGING — US US OB TRANSVAGINAL
1 series · 15 of 28 positions shown · non-contrast
Comparison: None.

CLINICAL DATA: Vaginal bleeding.

EXAM:
OBSTETRIC <14 WK US AND TRANSVAGINAL OB US
TECHNIQUE: Both transabdominal and transvaginal ultrasound examinations were
performed for complete evaluation of the gestation as well as the
maternal uterus, adnexal regions, and pelvic cul-de-sac.
Transvaginal technique was performed to assess early pregnancy.

[Series 1: us ob transvaginal · 15 of 46 slices shown]
[im 1/46]
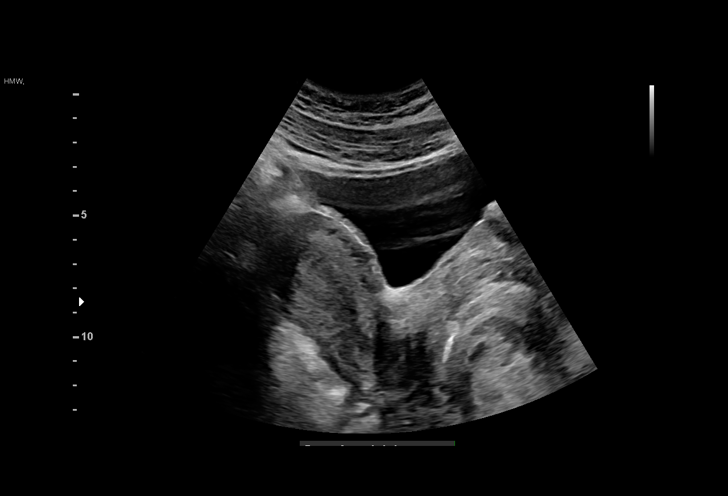
[im 4/46]
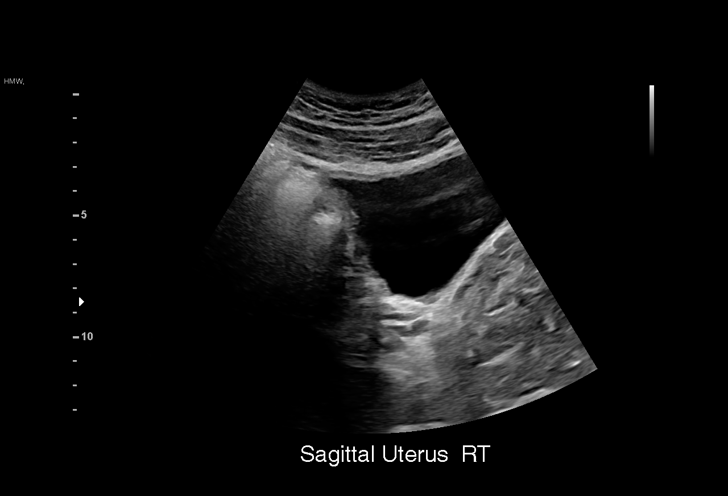
[im 7/46]
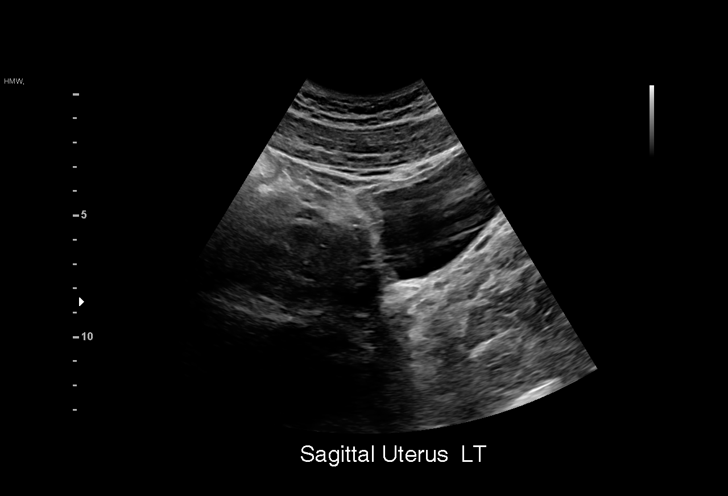
[im 11/46]
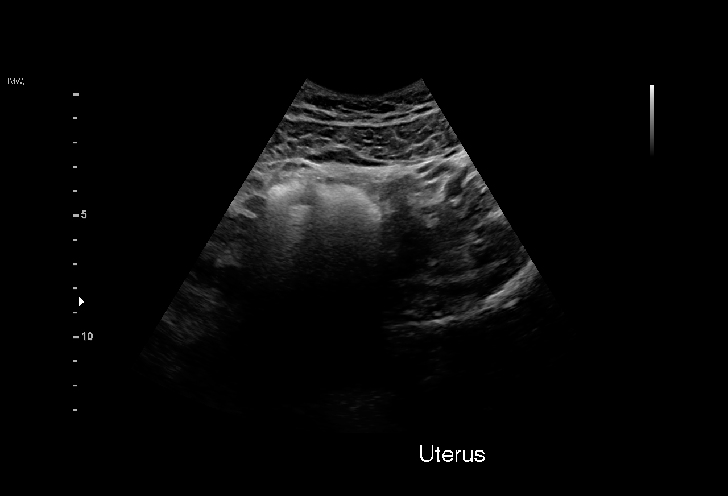
[im 14/46]
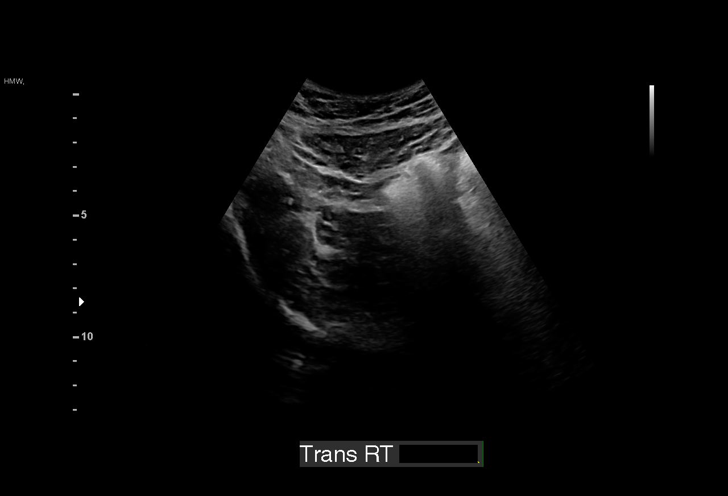
[im 17/46]
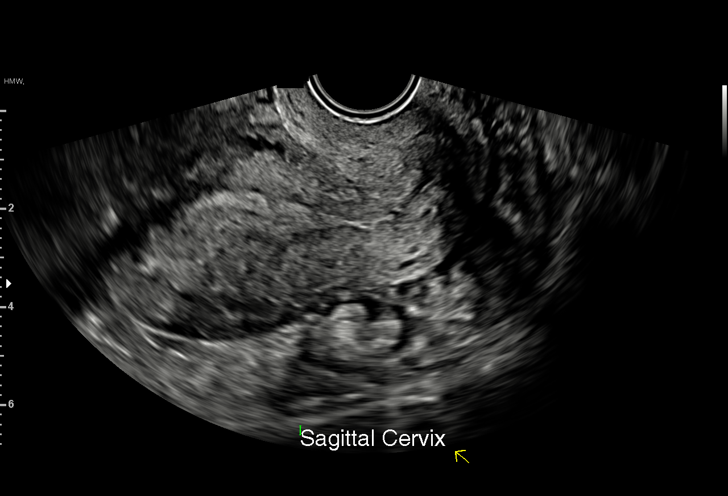
[im 21/46]
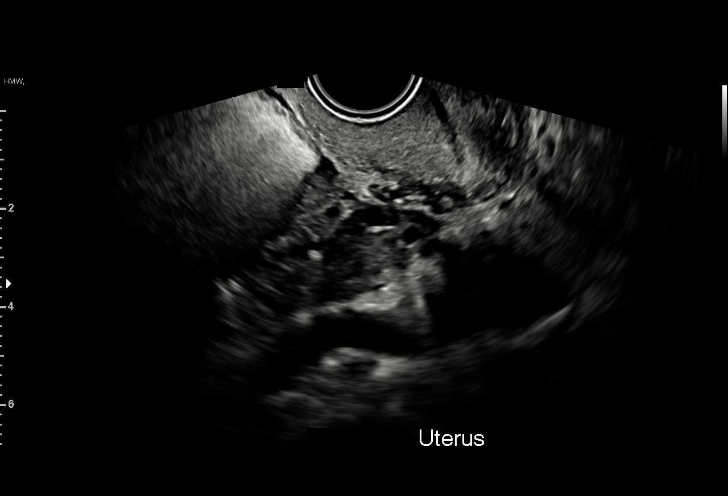
[im 24/46]
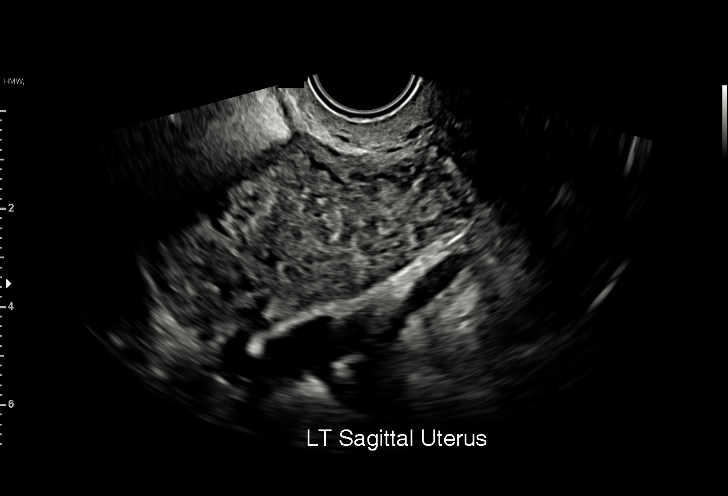
[im 26/46]
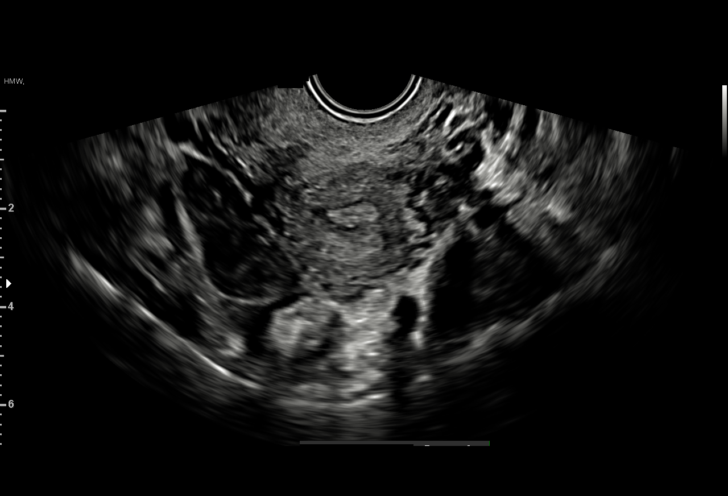
[im 29/46]
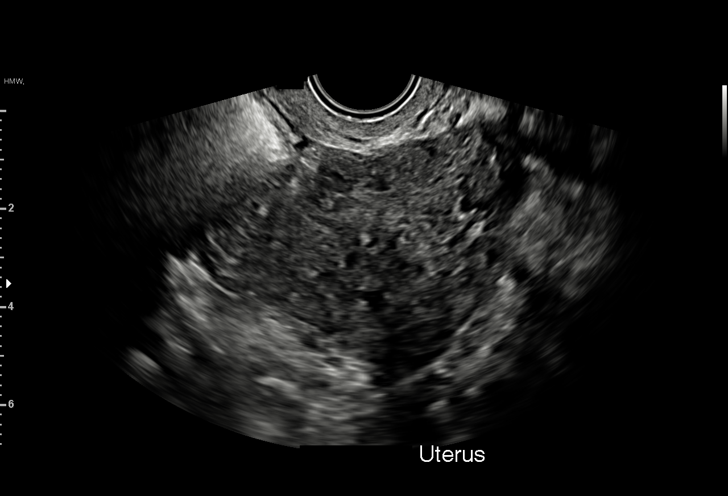
[im 32/46]
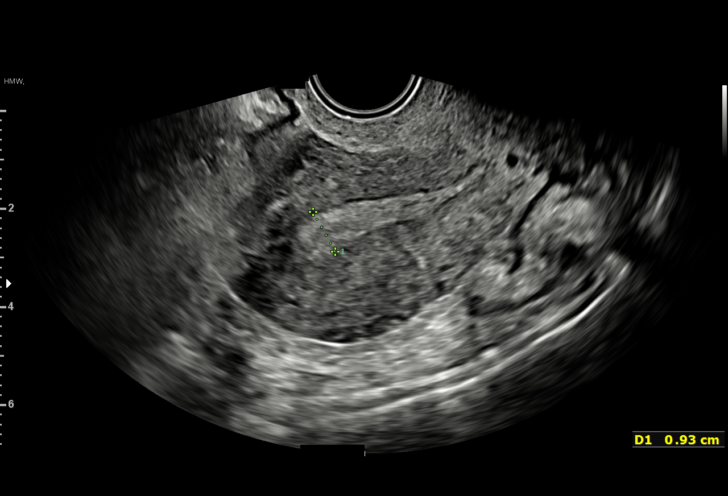
[im 36/46]
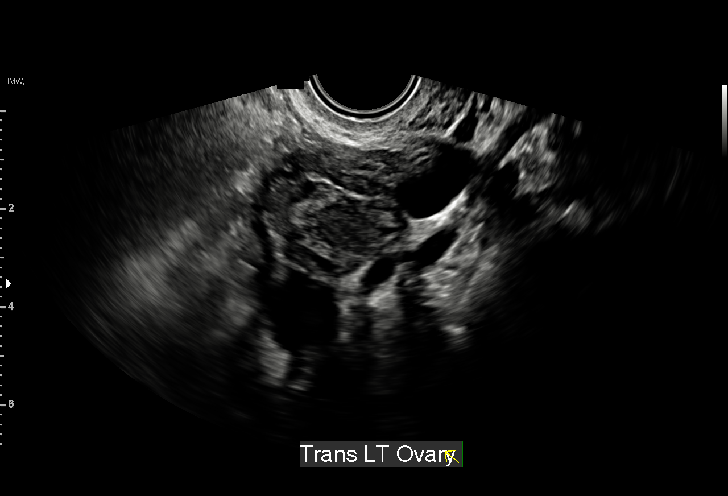
[im 39/46]
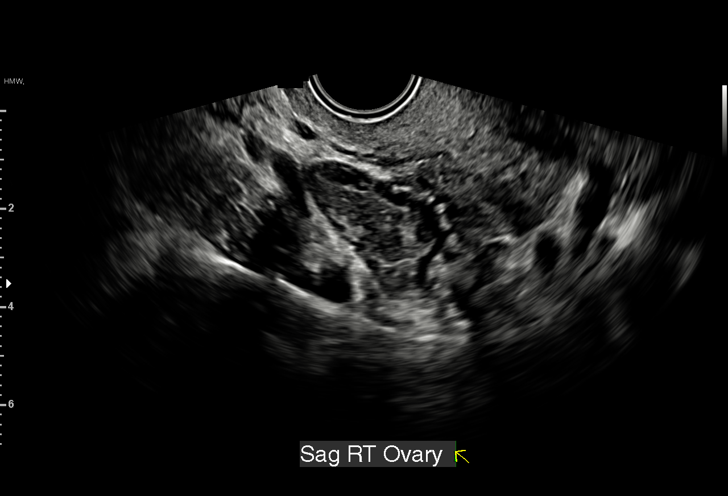
[im 42/46]
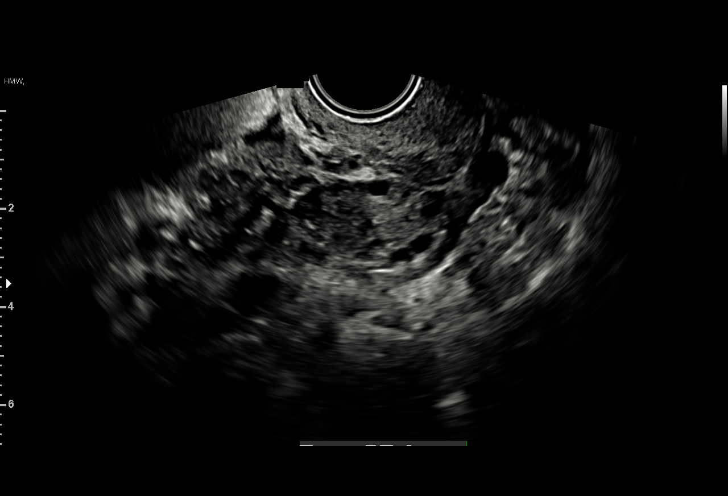
[im 46/46]
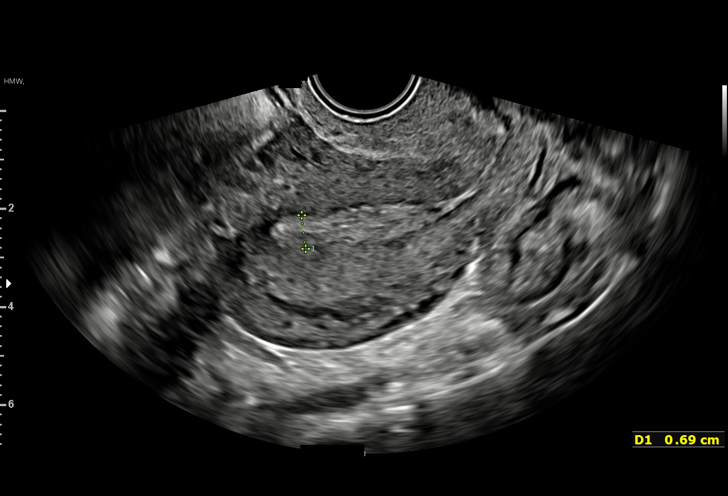

[15 of 28 positions shown; findings below may reference images not displayed]

FINDINGS: Intrauterine gestational sac: None

Yolk sac:  Not Visualized.

Embryo:  Not Visualized.

Cardiac Activity: Not Visualized.

Subchorionic hemorrhage:  None visualized.

Maternal uterus/adnexae: Ovaries are unremarkable. No free fluid is
noted.
IMPRESSION: No intrauterine gestational sac, yolk sac, fetal pole, or cardiac
activity visualized. Differential considerations include
intrauterine gestation too early to be sonographically visualized,
spontaneous abortion, or ectopic pregnancy. Consider follow-up
ultrasound in 14 days and serial quantitative beta HCG follow-up.

## 2023-10-18 DIAGNOSIS — Z01419 Encounter for gynecological examination (general) (routine) without abnormal findings: Secondary | ICD-10-CM | POA: Diagnosis not present

## 2023-10-18 DIAGNOSIS — Z113 Encounter for screening for infections with a predominantly sexual mode of transmission: Secondary | ICD-10-CM | POA: Diagnosis not present

## 2023-10-18 DIAGNOSIS — Z3201 Encounter for pregnancy test, result positive: Secondary | ICD-10-CM | POA: Diagnosis not present

## 2023-10-25 DIAGNOSIS — Z3400 Encounter for supervision of normal first pregnancy, unspecified trimester: Secondary | ICD-10-CM | POA: Diagnosis not present

## 2023-11-01 ENCOUNTER — Encounter (HOSPITAL_COMMUNITY): Payer: Self-pay | Admitting: *Deleted

## 2023-11-01 ENCOUNTER — Other Ambulatory Visit: Payer: Self-pay

## 2023-11-01 ENCOUNTER — Inpatient Hospital Stay (HOSPITAL_COMMUNITY)
Admission: AD | Admit: 2023-11-01 | Discharge: 2023-11-01 | Disposition: A | Discharge status: Home / Self Care | Payer: BC Managed Care – PPO | Attending: Obstetrics and Gynecology | Admitting: Obstetrics and Gynecology

## 2023-11-01 ENCOUNTER — Inpatient Hospital Stay (HOSPITAL_COMMUNITY): Payer: BC Managed Care – PPO

## 2023-11-01 DIAGNOSIS — O209 Hemorrhage in early pregnancy, unspecified: Secondary | ICD-10-CM | POA: Diagnosis not present

## 2023-11-01 DIAGNOSIS — Z3A01 Less than 8 weeks gestation of pregnancy: Secondary | ICD-10-CM | POA: Diagnosis not present

## 2023-11-01 DIAGNOSIS — O3680X0 Pregnancy with inconclusive fetal viability, not applicable or unspecified: Secondary | ICD-10-CM

## 2023-11-01 DIAGNOSIS — M549 Dorsalgia, unspecified: Secondary | ICD-10-CM | POA: Insufficient documentation

## 2023-11-01 DIAGNOSIS — O09521 Supervision of elderly multigravida, first trimester: Secondary | ICD-10-CM | POA: Insufficient documentation

## 2023-11-01 DIAGNOSIS — O09511 Supervision of elderly primigravida, first trimester: Secondary | ICD-10-CM | POA: Diagnosis not present

## 2023-11-01 DIAGNOSIS — O3481 Maternal care for other abnormalities of pelvic organs, first trimester: Secondary | ICD-10-CM | POA: Diagnosis not present

## 2023-11-01 DIAGNOSIS — O99891 Other specified diseases and conditions complicating pregnancy: Secondary | ICD-10-CM

## 2023-11-01 DIAGNOSIS — O26891 Other specified pregnancy related conditions, first trimester: Secondary | ICD-10-CM | POA: Diagnosis not present

## 2023-11-01 LAB — CBC
HCT: 39.8 % (ref 36.0–46.0)
Hemoglobin: 13.1 g/dL (ref 12.0–15.0)
MCH: 28.9 pg (ref 26.0–34.0)
MCHC: 32.9 g/dL (ref 30.0–36.0)
MCV: 87.7 fL (ref 80.0–100.0)
Platelets: 312 10*3/uL (ref 150–400)
RBC: 4.54 MIL/uL (ref 3.87–5.11)
RDW: 13.7 % (ref 11.5–15.5)
WBC: 11.5 10*3/uL — ABNORMAL HIGH (ref 4.0–10.5)
nRBC: 0 % (ref 0.0–0.2)

## 2023-11-01 LAB — URINALYSIS, ROUTINE W REFLEX MICROSCOPIC
Bilirubin Urine: NEGATIVE
Glucose, UA: NEGATIVE mg/dL
Ketones, ur: NEGATIVE mg/dL
Leukocytes,Ua: NEGATIVE
Nitrite: NEGATIVE
Protein, ur: NEGATIVE mg/dL
RBC / HPF: >50 RBC/hpf (ref 0–5)
Specific Gravity, Urine: 1.02 (ref 1.005–1.030)
pH: 5 (ref 5.0–8.0)

## 2023-11-01 LAB — WET PREP, GENITAL
Clue Cells Wet Prep HPF POC: NONE SEEN
Sperm: NONE SEEN
Trich, Wet Prep: NONE SEEN
WBC, Wet Prep HPF POC: <10 (ref <10)
Yeast Wet Prep HPF POC: NONE SEEN

## 2023-11-01 LAB — HIV ANTIBODY (ROUTINE TESTING W REFLEX): HIV Screen 4th Generation wRfx: NONREACTIVE

## 2023-11-01 LAB — HCG, QUANTITATIVE, PREGNANCY: hCG, Beta Chain, Quant, S: 456 m[IU]/mL — ABNORMAL HIGH (ref <5)

## 2023-11-01 NOTE — MAU Provider Note (Signed)
Chief Complaint: Vaginal Bleeding   Event Date/Time   First Provider Initiated Contact with Patient 11/01/23 2106        SUBJECTIVE HPI: Caroline Weber is a 38 y.o. G6P0050 at [redacted]w[redacted]d by LMP who presents to maternity admissions reporting vaginal bleeding today  Saw bright red blood with small clots.  Has mild back pain but no cramps. She denies vaginal itching/burning, urinary symptoms, n/v, or fever/chills.     Vaginal Bleeding The patient's primary symptoms include vaginal bleeding. The patient's pertinent negatives include no genital itching, genital odor or pelvic pain. The current episode started today.   RN Note: Caroline Weber is a 37 y.o. at [redacted]w[redacted]d here in MAU reporting: she is having VB with wiping, VB is bright red with small clots, began today.  Endorses "mild back cramps", denies abdominal pain/cramping.  Denies recent intercourse. LMP: 09/18/2023 Onset of complaint: today Pain score: 1  Past Medical History:  Diagnosis Date   Medical history non-contributory    Miscarriage 01/2018   Past Surgical History:  Procedure Laterality Date   NO PAST SURGERIES     Social History   Socioeconomic History   Marital status: Single    Spouse name: Not on file   Number of children: Not on file   Years of education: Not on file   Highest education level: Not on file  Occupational History   Not on file  Tobacco Use   Smoking status: Never   Smokeless tobacco: Never  Vaping Use   Vaping status: Never Used  Substance and Sexual Activity   Alcohol use: Yes   Drug use: No   Sexual activity: Yes    Partners: Male    Birth control/protection: None  Other Topics Concern   Not on file  Social History Narrative   Not on file   Social Determinants of Health   Financial Resource Strain: Not on file  Food Insecurity: Not on file  Transportation Needs: Not on file  Physical Activity: Not on file  Stress: Not on file  Social Connections: Not on file  Intimate Partner  Violence: Not on file   No current facility-administered medications on file prior to encounter.   Current Outpatient Medications on File Prior to Encounter  Medication Sig Dispense Refill   acetaminophen (TYLENOL) 500 MG tablet Take 1 tablet (500 mg total) by mouth every 6 (six) hours as needed. 30 tablet 0   fluconazole (DIFLUCAN) 150 MG tablet Take 1 tablet (150 mg total) by mouth every 3 (three) days as needed. 2 tablet 0   ibuprofen (ADVIL) 600 MG tablet Take 1 tablet (600 mg total) by mouth every 6 (six) hours as needed. 30 tablet 0   metroNIDAZOLE (FLAGYL) 500 MG tablet Take 1 tablet (500 mg total) by mouth 2 (two) times daily. 14 tablet 0   metroNIDAZOLE (METROGEL) 0.75 % vaginal gel Place 1 Applicatorful vaginally at bedtime. 70 g 0   No Known Allergies  I have reviewed patient's Past Medical Hx, Surgical Hx, Family Hx, Social Hx, medications and allergies.   ROS:  Review of Systems  Genitourinary:  Positive for vaginal bleeding. Negative for pelvic pain.   Review of Systems  Other systems negative   Physical Exam  Physical Exam Patient Vitals for the past 24 hrs:  BP Temp Temp src Pulse Resp SpO2 Height Weight  11/01/23 1606 122/80 98.1 F (36.7 C) Oral 83 17 100 % -- --  11/01/23 1602 -- -- -- -- -- -- 5'  2" (1.575 m) 90.9 kg   Constitutional: Well-developed, well-nourished female in no acute distress.  Cardiovascular: normal rate Respiratory: normal effort GI: Abd soft, non-tender.  MS: Extremities nontender, no edema, normal ROM Neurologic: Alert and oriented x 4.  GU: Neg CVAT.  PELVIC EXAM: Deferred in lieu of [place]  LAB RESULTS Results for orders placed or performed during the hospital encounter of 11/01/23 (from the past 24 hour(s))  Urinalysis, Routine w reflex microscopic -Urine, Clean Catch     Status: Abnormal   Collection Time: 11/01/23  4:15 PM  Result Value Ref Range   Color, Urine YELLOW YELLOW   APPearance HAZY (A) CLEAR   Specific Gravity,  Urine 1.020 1.005 - 1.030   pH 5.0 5.0 - 8.0   Glucose, UA NEGATIVE NEGATIVE mg/dL   Hgb urine dipstick LARGE (A) NEGATIVE   Bilirubin Urine NEGATIVE NEGATIVE   Ketones, ur NEGATIVE NEGATIVE mg/dL   Protein, ur NEGATIVE NEGATIVE mg/dL   Nitrite NEGATIVE NEGATIVE   Leukocytes,Ua NEGATIVE NEGATIVE   RBC / HPF >50 0 - 5 RBC/hpf   WBC, UA 0-5 0 - 5 WBC/hpf   Bacteria, UA FEW (A) NONE SEEN   Squamous Epithelial / HPF 0-5 0 - 5 /HPF   Mucus PRESENT   Wet prep, genital     Status: None   Collection Time: 11/01/23  5:52 PM  Result Value Ref Range   Yeast Wet Prep HPF POC NONE SEEN NONE SEEN   Trich, Wet Prep NONE SEEN NONE SEEN   Clue Cells Wet Prep HPF POC NONE SEEN NONE SEEN   WBC, Wet Prep HPF POC <10 <10   Sperm NONE SEEN   CBC     Status: Abnormal   Collection Time: 11/01/23  6:21 PM  Result Value Ref Range   WBC 11.5 (H) 4.0 - 10.5 K/uL   RBC 4.54 3.87 - 5.11 MIL/uL   Hemoglobin 13.1 12.0 - 15.0 g/dL   HCT 81.1 91.4 - 78.2 %   MCV 87.7 80.0 - 100.0 fL   MCH 28.9 26.0 - 34.0 pg   MCHC 32.9 30.0 - 36.0 g/dL   RDW 95.6 21.3 - 08.6 %   Platelets 312 150 - 400 K/uL   nRBC 0.0 0.0 - 0.2 %  hCG, quantitative, pregnancy     Status: Abnormal   Collection Time: 11/01/23  6:21 PM  Result Value Ref Range   hCG, Beta Chain, Quant, S 456 (H) <5 mIU/mL  HIV Antibody (routine testing w rflx)     Status: None   Collection Time: 11/01/23  6:21 PM  Result Value Ref Range   HIV Screen 4th Generation wRfx Non Reactive Non Reactive       IMAGING US OB LESS THAN 14 WEEKS WITH OB TRANSVAGINAL  Result Date: 11/01/2023 CLINICAL DATA:  [id number] with vaginal bleeding affecting early pregnancy. EXAM: OBSTETRIC <14 WK [place] AND TRANSVAGINAL OB US TECHNIQUE: Both transabdominal and transvaginal ultrasound examinations were performed for complete evaluation of the gestation as well as the maternal uterus, adnexal regions, and pelvic cul-de-sac. Transvaginal technique was performed to assess early  pregnancy. COMPARISON:  None from this pregnancy. FINDINGS: Intrauterine gestational sac: Single gestational sac noted, but with an unusual elliptical shape and located in the mid to lower uterine lumen. Yolk sac:  Not Visualized. Embryo:  Not Visualized. Cardiac Activity: N/a. MSD: 4.8 mm   5 w   2 d CRL: None.  [place] EDC: 07/01/2024. Subchorionic hemorrhage:  None visualized. Maternal uterus/adnexae: Left  ovary measures 3.6 x 1.7 x 2.4 cm and is sonographically unremarkable. Right ovary measures 2.0 x 3.4 x 1.9 cm and contains a 1.6 cm thick-walled cystic structure consistent with a corpus luteum. No free fluid or adnexal mass are seen. No suspicious findings. The uterus is anteverted measuring 8.7 x 5.0 by 6.5 cm. No wall mass is seen. The cervix is closed. IMPRESSION: 1. Single gestational sac measuring 5 weeks 2 days, but elliptical and not rounded in shape and located in the proximal to mid uterine lumen. No yolk sac or fetal pole visible. Recommend follow-up quantitative B-HCG levels and follow-up US in 14 days to assess viability. This recommendation follows SRU consensus guidelines: Diagnostic Criteria for Nonviable Pregnancy Early in the First Trimester. Malva Limes Med 2013; 160:7371-06. 2. Also possible this could be a pseudogestational sac from an ectopic pregnancy, but no adnexal mass or free fluid are visible at this time. Close clinical follow-up recommended. 3. 1.6 cm corpus luteum in the right ovary. Electronically Signed   By: Almira Bar M.D.   On: 11/01/2023 20:47    MAU Management/MDM: I have reviewed the triage vital signs and the nursing notes.   Pertinent labs & imaging results that were available during my care of the patient were reviewed by me and considered in my medical decision making (see chart for details).      I have reviewed her medical records including past results, notes and treatments. Medical, Surgical, and family history were reviewed.  Medications and recent lab tests  were reviewed  Ordered usual first trimester r/o ectopic labs.   Pelvic exam and cultures done Will check baseline Ultrasound to rule out ectopic.  This bleeding/pain can represent a normal pregnancy with bleeding, spontaneous abortion or even an ectopic which can be life-threatening.  The process as listed above helps to determine which of these is present.  Discussed findings of low HCG with [place] evidence of intrauterine gestational sac (presumed, not conclusive) which is elliptical and low in uterus.  Discussed that this is not conclusive but may suggest a nonviable pregnancy Recommend [place] followup  States has [place] scheduled at [facility] on 12/2   ASSESSMENT Pregnancy at [redacted]w[redacted]d by LMP, 5 wk by sac size Bleeding In early pregnancy Back pain  PLAN Discharge home Plan to repeat Ultrasound at [facility] as planned    Ectopic/SAB precautions  Pt stable at time of discharge. Encouraged to return here if she develops worsening of symptoms, increase in pain, fever, or other concerning symptoms.    Wynelle Bourgeois CNM, MSN Certified Nurse-Midwife 11/01/2023  9:06 PM

## 2023-11-01 NOTE — MAU Note (Signed)
Caroline Weber is a 37 y.o. at [redacted]w[redacted]d here in MAU reporting: she is having VB with wiping, VB is bright red with small clots, began today.  Endorses "mild back cramps", denies abdominal pain/cramping.  Denies recent intercourse. LMP: 09/18/2023 Onset of complaint: today Pain score: 1 Vitals:   11/01/23 1606  BP: 122/80  Pulse: 83  Resp: 17  Temp: 98.1 F (36.7 C)  SpO2: 100%     FHT:NA Lab orders placed from triage:   UA

## 2023-11-02 LAB — RPR: RPR Ser Ql: NONREACTIVE

## 2023-11-02 LAB — GC/CHLAMYDIA PROBE AMP (~~LOC~~) NOT AT ARMC
Chlamydia: NEGATIVE
Comment: NEGATIVE
Comment: NORMAL
Neisseria Gonorrhea: NEGATIVE

## 2023-11-04 ENCOUNTER — Other Ambulatory Visit: Payer: Self-pay

## 2023-11-04 ENCOUNTER — Encounter: Payer: Self-pay | Admitting: Certified Nurse Midwife

## 2023-11-04 ENCOUNTER — Inpatient Hospital Stay (HOSPITAL_COMMUNITY)
Admission: AD | Admit: 2023-11-04 | Discharge: 2023-11-04 | Disposition: A | Discharge status: Home / Self Care | Payer: BC Managed Care – PPO | Attending: Obstetrics & Gynecology | Admitting: Obstetrics & Gynecology

## 2023-11-04 DIAGNOSIS — Z3A01 Less than 8 weeks gestation of pregnancy: Secondary | ICD-10-CM | POA: Insufficient documentation

## 2023-11-04 DIAGNOSIS — O209 Hemorrhage in early pregnancy, unspecified: Secondary | ICD-10-CM | POA: Diagnosis not present

## 2023-11-04 LAB — HCG, QUANTITATIVE, PREGNANCY: hCG, Beta Chain, Quant, S: 71 m[IU]/mL — ABNORMAL HIGH (ref <5)

## 2023-11-04 NOTE — MAU Note (Signed)
.  Caroline Weber is a 37 y.o. at [redacted]w[redacted]d here in MAU reporting: was seen here on Tuesday - was told to come back to get Hcg levels checked. Reports still having light VB since Tuesday, but no change. Denies pain.   Pain score: 0 Vitals:   11/04/23 0653  BP: 130/89  Pulse: 80  Resp: 18  Temp: 97.7 F (36.5 C)  SpO2: 100%     FHT:NA  Lab orders placed from triage:  none

## 2023-11-04 NOTE — MAU Provider Note (Signed)
Event Date/Time   First Provider Initiated Contact with Patient 11/04/23 [phone]      S Ms. Caroline Weber is a 37 y.o. [phone] patient who presents to MAU today for repeat hCG level.  Patient reports she has continued vaginal bleeding with "scattered clots."  She states she can "keep a pad on all day to catch whatever is coming," but does not fill the pad.  She also reports continued intermittent cramping.   O BP 130/89 (BP Location: Right Arm)   Pulse 80   Temp 97.7 F (36.5 C) (Oral)   Resp 18   Ht 5\' 2"  (1.575 m)   Wt 90.7 kg   LMP 09/18/2023   SpO2 100%   BMI 36.58 kg/m  Physical Exam Vitals and nursing note reviewed.  Constitutional:      Appearance: Normal appearance.  HENT:     Head: Normocephalic and atraumatic.  Eyes:     Conjunctiva/sclera: Conjunctivae normal.  Cardiovascular:     Rate and Rhythm: Normal rate.  Pulmonary:     Effort: Pulmonary effort is normal. No respiratory distress.  Musculoskeletal:        General: Normal range of motion.     Cervical back: Normal range of motion.  Neurological:     Mental Status: She is alert and oriented to person, place, and time.  Psychiatric:        Mood and Affect: Mood normal.        Behavior: Behavior normal.     A Medical screening exam complete Repeat hCG   P -Patient given option for discharge vs wait. -She opts for discharge with understanding that upon result return, if necessary, she will return for further evaluation. -Patient without further questions or concerns. -Declines need for pain medication. -hCG ordered. -Discharge to home in stable condition.   Gerrit Heck, CNM 11/04/2023 7:02 AM

## 2023-11-07 DIAGNOSIS — O09291 Supervision of pregnancy with other poor reproductive or obstetric history, first trimester: Secondary | ICD-10-CM | POA: Diagnosis not present

## 2023-11-07 DIAGNOSIS — O09521 Supervision of elderly multigravida, first trimester: Secondary | ICD-10-CM | POA: Diagnosis not present

## 2023-11-07 DIAGNOSIS — O26891 Other specified pregnancy related conditions, first trimester: Secondary | ICD-10-CM | POA: Diagnosis not present

## 2023-11-14 DIAGNOSIS — O039 Complete or unspecified spontaneous abortion without complication: Secondary | ICD-10-CM | POA: Diagnosis not present

## 2023-11-14 DIAGNOSIS — N96 Recurrent pregnancy loss: Secondary | ICD-10-CM | POA: Diagnosis not present

## 2023-11-16 DIAGNOSIS — O0281 Inappropriate change in quantitative human chorionic gonadotropin (hCG) in early pregnancy: Secondary | ICD-10-CM | POA: Diagnosis not present

## 2023-11-16 DIAGNOSIS — O039 Complete or unspecified spontaneous abortion without complication: Secondary | ICD-10-CM | POA: Diagnosis not present

## 2023-11-22 DIAGNOSIS — N898 Other specified noninflammatory disorders of vagina: Secondary | ICD-10-CM | POA: Diagnosis not present

## 2023-12-08 ENCOUNTER — Encounter: Payer: Managed Care, Other (non HMO) | Admitting: Obstetrics & Gynecology

## 2024-01-03 DIAGNOSIS — N898 Other specified noninflammatory disorders of vagina: Secondary | ICD-10-CM | POA: Diagnosis not present

## 2024-01-03 DIAGNOSIS — K5900 Constipation, unspecified: Secondary | ICD-10-CM | POA: Diagnosis not present

## 2024-01-03 DIAGNOSIS — Z113 Encounter for screening for infections with a predominantly sexual mode of transmission: Secondary | ICD-10-CM | POA: Diagnosis not present

## 2024-01-03 DIAGNOSIS — K644 Residual hemorrhoidal skin tags: Secondary | ICD-10-CM | POA: Diagnosis not present

## 2024-01-20 ENCOUNTER — Encounter (HOSPITAL_COMMUNITY): Payer: Self-pay

## 2024-01-20 ENCOUNTER — Ambulatory Visit (HOSPITAL_COMMUNITY)
Admission: EM | Admit: 2024-01-20 | Discharge: 2024-01-20 | Disposition: A | Discharge status: Home / Self Care | Payer: BC Managed Care – PPO | Attending: Emergency Medicine | Admitting: Emergency Medicine

## 2024-01-20 DIAGNOSIS — M546 Pain in thoracic spine: Secondary | ICD-10-CM | POA: Diagnosis present

## 2024-01-20 DIAGNOSIS — Z113 Encounter for screening for infections with a predominantly sexual mode of transmission: Secondary | ICD-10-CM | POA: Insufficient documentation

## 2024-01-20 DIAGNOSIS — R310 Gross hematuria: Secondary | ICD-10-CM | POA: Diagnosis present

## 2024-01-20 DIAGNOSIS — N898 Other specified noninflammatory disorders of vagina: Secondary | ICD-10-CM | POA: Insufficient documentation

## 2024-01-20 LAB — POCT URINALYSIS DIP (MANUAL ENTRY)
Bilirubin, UA: NEGATIVE
Glucose, UA: NEGATIVE mg/dL
Ketones, POC UA: NEGATIVE mg/dL
Leukocytes, UA: NEGATIVE
Nitrite, UA: NEGATIVE
Protein Ur, POC: 30 mg/dL — AB
Spec Grav, UA: 1.02 (ref 1.010–1.025)
Urobilinogen, UA: 0.2 U/dL
pH, UA: 5.5 (ref 5.0–8.0)

## 2024-01-20 MED ORDER — NAPROXEN 500 MG PO TABS: 500.0000 mg | ORAL_TABLET | Freq: Two times a day (BID) | ORAL | 0 refills | Status: DC

## 2024-01-20 NOTE — Discharge Instructions (Addendum)
You can take naproxen twice daily as needed for back pain.  Do not take this with other NSAIDs such as ibuprofen, and Advil, Aleve, and Motrin. You can alternate this with Tylenol. Otherwise you can apply a heating pad to help with back pain as well.  Your results will come back over the next few days and someone will call if results are positive and require treatment.  Return here as needed.

## 2024-01-20 NOTE — ED Triage Notes (Signed)
Vaginal odor and discharge. Patient request STI testing.Denies any exposure.   Patient reports lower back pain. Would like her urine check for UTI.

## 2024-01-20 NOTE — ED Provider Notes (Signed)
[facility]    CSN: [id number] Arrival date & time: 01/20/24  0801      History   Chief Complaint No chief complaint on file.   HPI Caroline Weber is a 38 y.o. female.   Patient presents with left-sided back pain and vaginal odor x 2 to 3 days.  Patient states that she had similar pain on the right side in the past and had a kidney infection.  Patient is currently on her menstrual cycle.  Denies dysuria, hematuria, fever, abdominal pain, vaginal discharge, and vaginal irritation.  Patient is also requesting STD testing.     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 Authorizing Provider  naproxen (NAPROSYN) 500 MG tablet Take 1 tablet (500 mg total) by mouth 2 (two) times daily. 01/20/24  Yes Wynonia Lawman A, NP  acetaminophen (TYLENOL) 500 MG tablet Take 1 tablet (500 mg total) by mouth every 6 (six) hours as needed. 04/26/23   Redwine, Madison A, PA-C    Family History Family History  Problem Relation Age of Onset   Healthy Mother    Hypertension Father     Social History Social History   Tobacco Use   Smoking status: Never   Smokeless tobacco: Never  Vaping Use   Vaping status: Never Used  Substance Use Topics   Alcohol use: Yes   Drug use: No     Allergies   Patient has no known allergies.   Review of Systems Review of Systems  Per  HPI  Physical Exam Triage Vital Signs ED Triage Vitals [01/20/24 0830]  Encounter Vitals Group     BP 125/82     Systolic BP Percentile      Diastolic BP Percentile      Pulse Rate 73     Resp 16     Temp 98.2 F (36.8 C)     Temp Source Oral     SpO2 97 %     Weight      Height      Head Circumference      Peak Flow      Pain Score      Pain Loc      Pain Education      Exclude from Growth Chart    No data found.  Updated Vital Signs BP 125/82 (BP Location: Left Arm)   Pulse 73   Temp 98.2 F (36.8 C) (Oral)   Resp 16   LMP 01/18/2024   SpO2 97%   Breastfeeding Unknown   Visual Acuity Right Eye  Distance:   Left Eye Distance:   Bilateral Distance:    Right Eye Near:   Left Eye Near:    Bilateral Near:     Physical Exam Vitals and nursing note reviewed.  Constitutional:      General: She is awake. She is not in acute distress.    Appearance: Normal appearance. She is well-developed and well-groomed. She is not ill-appearing.  Abdominal:     General: Abdomen is flat. Bowel sounds are normal.     Palpations: Abdomen is soft.     Tenderness: There is no abdominal tenderness. There is no right CVA tenderness or left CVA tenderness.  Musculoskeletal:     Cervical back: Normal.     Thoracic back: Tenderness present.     Lumbar back: Normal.  Skin:    General: Skin is warm and dry.  Neurological:     Mental Status: She is alert.  Psychiatric:        Behavior: Behavior is cooperative.      UC Treatments / Results  Labs (all labs ordered are listed, but only abnormal results are displayed) Labs Reviewed  POCT URINALYSIS DIP (MANUAL ENTRY) - Abnormal; Notable for the following components:      Result Value   Color, UA orange (*)    Blood, UA large (*)    Protein Ur, POC =30 (*)    All other components within normal limits  URINE CULTURE  CERVICOVAGINAL ANCILLARY ONLY    EKG   Radiology No results found.  Procedures Procedures (including  critical care time)  Medications Ordered in UC Medications - No data to display  Initial Impression / Assessment and Plan / UC Course  I have reviewed the triage vital signs and the nursing notes.  Pertinent labs & imaging results that were available during my care of the patient were reviewed by me and considered in my medical decision making (see chart for details).     Patient presented with 2 to 3-day history of left-sided back pain and vaginal odor.  Patient is concerned for kidney infection due to similar pain on the right side in the past.  Denies any other symptoms.  Patient is currently on her menstrual cycle.  Patient is also requesting STD testing.  Upon assessment patient is mildly tender to left thoracic region of back.  Without CVA tenderness.  Denies movement pain.  Nontender upon palpation to abdomen.  UA revealed large RBCs, most likely related to patient currently being on menstrual cycle.  Will send culture to confirm the presence of underlying infection.  GU exam deferred.  Patient perform self swab for STD/STI.  Declines HIV and syphilis testing.  Prescribe naproxen as needed for back pain.  Discussed follow-up and return precautions. Final Clinical Impressions(s) / UC Diagnoses   Final diagnoses:  Acute left-sided thoracic back pain  Screening for STD (sexually transmitted disease)  Gross hematuria     Discharge Instructions      You can take naproxen twice daily as needed for back pain.  Do not take this with other NSAIDs such as ibuprofen, and Advil, Aleve, and Motrin. You can alternate this with Tylenol. Otherwise you can apply a heating pad to help with back pain as well.  Your results will come back over the next few days and someone will call if results are positive and require treatment.  Return here as needed.     ED Prescriptions     Medication Sig Dispense Auth. Provider  naproxen (NAPROSYN) 500 MG tablet Take 1 tablet (500 mg total) by  mouth 2 (two) times daily. 30 tablet Wynonia Lawman A, NP      PDMP not reviewed this encounter.   Wynonia Lawman A, NP 01/20/24 [phone]

## 2024-01-21 LAB — URINE CULTURE: Culture: <10000 — AB

## 2024-01-23 LAB — CERVICOVAGINAL ANCILLARY ONLY
Bacterial Vaginitis (gardnerella): NEGATIVE
Candida Glabrata: NEGATIVE
Candida Vaginitis: NEGATIVE
Chlamydia: NEGATIVE
Comment: NEGATIVE
Comment: NEGATIVE
Comment: NEGATIVE
Comment: NEGATIVE
Comment: NEGATIVE
Comment: NORMAL
Neisseria Gonorrhea: NEGATIVE
Trichomonas: NEGATIVE

## 2024-02-06 DIAGNOSIS — N898 Other specified noninflammatory disorders of vagina: Secondary | ICD-10-CM | POA: Diagnosis not present

## 2024-02-23 ENCOUNTER — Ambulatory Visit (HOSPITAL_COMMUNITY)
Admission: EM | Admit: 2024-02-23 | Discharge: 2024-02-23 | Disposition: A | Discharge status: Home / Self Care | Attending: Family Medicine | Admitting: Family Medicine

## 2024-02-23 ENCOUNTER — Encounter (HOSPITAL_COMMUNITY): Payer: Self-pay | Admitting: Family Medicine

## 2024-02-23 DIAGNOSIS — Z113 Encounter for screening for infections with a predominantly sexual mode of transmission: Secondary | ICD-10-CM | POA: Diagnosis not present

## 2024-02-23 DIAGNOSIS — Z711 Person with feared health complaint in whom no diagnosis is made: Secondary | ICD-10-CM | POA: Insufficient documentation

## 2024-02-23 DIAGNOSIS — N898 Other specified noninflammatory disorders of vagina: Secondary | ICD-10-CM | POA: Insufficient documentation

## 2024-02-23 LAB — RPR: RPR Ser Ql: NONREACTIVE

## 2024-02-23 LAB — POCT URINE PREGNANCY: Preg Test, Ur: NEGATIVE

## 2024-02-23 LAB — HIV ANTIBODY (ROUTINE TESTING W REFLEX): HIV Screen 4th Generation wRfx: NONREACTIVE

## 2024-02-23 NOTE — Discharge Instructions (Addendum)
 We will follow-up with the results from the lab In the meantime make sure you increase your oral hydration Monitor for any rashes, vaginal lesions, or bumps

## 2024-02-23 NOTE — ED Provider Notes (Signed)
 [facility]    CSN: [id number] Arrival date & time: 02/23/24  0801      History   Chief Complaint Chief Complaint  Patient presents with   Vaginal Discharge    HPI Caroline Weber is a 38 y.o. female.   The patient presents with several days of off-white, thin, nonodorous vaginal discharge with a history of repeated BV and vaginal yeast infections.  She has not had any new sexual partners but does have unprotected intercourse frequently.  She denies any fevers, chills, rashes, joint pain, abdominal pain, dysuria, hematuria, flank pain, vaginal ulcers, lumps, or bumps.  She is drinking only 1-2 bottles of water a day but denies douching, recent illnesses, or antibiotics.  The history is provided by the patient.  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 Authorizing Provider  acetaminophen (TYLENOL) 500 MG tablet Take 1 tablet (500 mg total) by mouth every 6 (six) hours as needed. 04/26/23   Redwine, Madison A, PA-C  naproxen (NAPROSYN) 500 MG tablet Take 1 tablet (500 mg total) by mouth 2 (two) times daily. 01/20/24   Letta Kocher, NP    Family History Family History  Problem Relation Age of Onset   Healthy Mother    Hypertension Father      Social History Social History   Tobacco Use   Smoking status: Never   Smokeless tobacco: Never  Vaping Use   Vaping status: Never Used  Substance Use Topics   Alcohol use: Yes   Drug use: No     Allergies   Patient has no known allergies.   Review of Systems Review of Systems  HENT:  Negative for mouth sores, sore throat, trouble swallowing and voice change.   Eyes:  Negative for discharge and redness.  Gastrointestinal:  Negative for abdominal pain, constipation, diarrhea, nausea and vomiting.  Genitourinary:  Positive for vaginal discharge. Negative for decreased urine volume, difficulty urinating, dyspareunia, dysuria, flank pain, frequency, genital sores, hematuria, menstrual problem, pelvic pain, urgency, vaginal bleeding and vaginal pain.  Musculoskeletal:  Negative for arthralgias, joint swelling and myalgias.  Skin:  Negative for rash.     Physical Exam Triage Vital Signs ED Triage Vitals  Encounter Vitals Group     BP 02/23/24 0813 (!) 130/93     Systolic BP Percentile --      Diastolic BP Percentile --      Pulse Rate 02/23/24 0813 83  Resp 02/23/24 0813 14     Temp 02/23/24 0813 98.3 F (36.8 C)     Temp Source 02/23/24 0813 Oral     SpO2 02/23/24 0813 96 %     Weight --      Height --      Head Circumference --      Peak Flow --      Pain Score 02/23/24 0812 0     Pain Loc --      Pain Education --      Exclude from Growth Chart --    No data found.  Updated Vital Signs BP (!) 130/93 (BP Location: Right Arm)   Pulse 83   Temp 98.3 F (36.8 C) (Oral)   Resp 14   LMP 10/14/2023   SpO2 96%   Breastfeeding No   Visual Acuity Right Eye Distance:   Left Eye Distance:   Bilateral Distance:    Right Eye Near:   Left Eye Near:    Bilateral Near:     Physical Exam Constitutional:      General: She is not in acute distress.    Appearance: Normal appearance. She is not ill-appearing, toxic-appearing or diaphoretic.  HENT:     Head:  Normocephalic and atraumatic.     Nose: Nose normal. No congestion or rhinorrhea.     Mouth/Throat:     Mouth: Mucous membranes are moist.     Pharynx: Oropharynx is clear.  Eyes:     General: No scleral icterus.       Right eye: No discharge.        Left eye: No discharge.     Extraocular Movements: Extraocular movements intact.     Pupils: Pupils are equal, round, and reactive to light.  Pulmonary:     Effort: Pulmonary effort is normal.  Abdominal:     General: Bowel sounds are normal. There is no distension.     Palpations: Abdomen is soft. There is no mass.     Tenderness: There is no abdominal tenderness. There is no right CVA tenderness, left CVA tenderness, guarding or rebound.  Skin:    General: Skin is warm.     Capillary Refill: Capillary refill takes more than 3 seconds.     Coloration: Skin is not jaundiced.     Findings: No erythema or rash.  Neurological:     General: No focal deficit present.     Mental Status: She is alert.  Psychiatric:        Mood and Affect: Mood normal.        Behavior: Behavior normal.        Thought Content: Thought content normal.      UC Treatments / Results  Labs (all labs ordered are listed, but only abnormal results are displayed) Labs Reviewed  HIV ANTIBODY (ROUTINE TESTING W REFLEX)  RPR  POCT URINE PREGNANCY  CERVICOVAGINAL ANCILLARY ONLY    EKG   Radiology No results found.  Procedures Procedures (including critical care time)  Medications Ordered in UC Medications - No data to display  Initial Impression / Assessment and Plan / UC Course  I have reviewed the triage vital signs and the nursing notes.  Pertinent labs & imaging results that were available during my care of the patient were reviewed by me and considered in my medical decision making (see chart for details).     Vaginal discharge - The patient does have a history of repeated BV and yeast infections.  No concern for PID at this point but she  does have concern for exposure to STDs.  No external lesions or systemic symptoms. - GC, RPR, HIV, trichomonas, BV pending - Urine pregnancy negative - We will follow-up with her results. - I counseled the patient on avoiding douching, maintaining good hydration, good hygiene particularly prior to intercourse, and following up regularly with PCP for possible ways of decreasing risk of recurrence. - The patient voiced understanding and agreement with the plan.   Final Clinical Impressions(s) / UC Diagnoses   Final diagnoses:  Concern about STD in female without diagnosis  Vaginal discharge     Discharge Instructions      We will follow-up with the results from the lab In the meantime make sure you increase your oral hydration Monitor for any rashes, vaginal lesions, or bumps      ED Prescriptions   None    PDMP not reviewed this encounter.   Ivor Messier, MD 02/23/24 [phone]

## 2024-02-23 NOTE — ED Triage Notes (Signed)
 Pt requesting STD testing since she has had off white vaginal discharge for a couple days. Denies known exposure.

## 2024-02-24 ENCOUNTER — Telehealth: Payer: Self-pay

## 2024-02-24 LAB — CERVICOVAGINAL ANCILLARY ONLY
Bacterial Vaginitis (gardnerella): POSITIVE — AB
Candida Glabrata: NEGATIVE
Candida Vaginitis: NEGATIVE
Chlamydia: NEGATIVE
Comment: NEGATIVE
Comment: NEGATIVE
Comment: NEGATIVE
Comment: NEGATIVE
Comment: NEGATIVE
Comment: NORMAL
Neisseria Gonorrhea: NEGATIVE
Trichomonas: NEGATIVE

## 2024-02-24 MED ORDER — METRONIDAZOLE 500 MG PO TABS: 500.0000 mg | ORAL_TABLET | Freq: Two times a day (BID) | ORAL | 0 refills | Status: AC

## 2024-02-24 NOTE — Telephone Encounter (Signed)
 Per protocol, pt requires tx with metronidazole. Rx sent to pharmacy on file.

## 2024-03-13 DIAGNOSIS — Z113 Encounter for screening for infections with a predominantly sexual mode of transmission: Secondary | ICD-10-CM | POA: Diagnosis not present

## 2024-03-13 DIAGNOSIS — Z3202 Encounter for pregnancy test, result negative: Secondary | ICD-10-CM | POA: Diagnosis not present

## 2024-03-13 DIAGNOSIS — Z131 Encounter for screening for diabetes mellitus: Secondary | ICD-10-CM | POA: Diagnosis not present

## 2024-03-13 DIAGNOSIS — Z0000 Encounter for general adult medical examination without abnormal findings: Secondary | ICD-10-CM | POA: Diagnosis not present

## 2024-03-13 DIAGNOSIS — E66812 Obesity, class 2: Secondary | ICD-10-CM | POA: Diagnosis not present

## 2024-03-13 DIAGNOSIS — Z6835 Body mass index (BMI) 35.0-35.9, adult: Secondary | ICD-10-CM | POA: Diagnosis not present

## 2024-04-17 DIAGNOSIS — N898 Other specified noninflammatory disorders of vagina: Secondary | ICD-10-CM | POA: Diagnosis not present

## 2024-05-01 ENCOUNTER — Encounter (HOSPITAL_COMMUNITY): Payer: Self-pay

## 2024-05-01 ENCOUNTER — Ambulatory Visit (HOSPITAL_COMMUNITY)
Admission: EM | Admit: 2024-05-01 | Discharge: 2024-05-01 | Disposition: A | Discharge status: Home / Self Care | Attending: Family Medicine | Admitting: Family Medicine

## 2024-05-01 DIAGNOSIS — Z3202 Encounter for pregnancy test, result negative: Secondary | ICD-10-CM | POA: Diagnosis not present

## 2024-05-01 DIAGNOSIS — Z113 Encounter for screening for infections with a predominantly sexual mode of transmission: Secondary | ICD-10-CM | POA: Insufficient documentation

## 2024-05-01 DIAGNOSIS — R300 Dysuria: Secondary | ICD-10-CM | POA: Diagnosis not present

## 2024-05-01 DIAGNOSIS — N3090 Cystitis, unspecified without hematuria: Secondary | ICD-10-CM | POA: Diagnosis not present

## 2024-05-01 LAB — POCT URINALYSIS DIP (MANUAL ENTRY)
Bilirubin, UA: NEGATIVE
Glucose, UA: NEGATIVE mg/dL
Ketones, POC UA: NEGATIVE mg/dL
Nitrite, UA: POSITIVE — AB
Protein Ur, POC: 30 mg/dL — AB
Spec Grav, UA: 1.025 (ref 1.010–1.025)
Urobilinogen, UA: 0.2 U/dL
pH, UA: 6 (ref 5.0–8.0)

## 2024-05-01 LAB — HIV ANTIBODY (ROUTINE TESTING W REFLEX): HIV Screen 4th Generation wRfx: NONREACTIVE

## 2024-05-01 LAB — POCT URINE PREGNANCY: Preg Test, Ur: NEGATIVE

## 2024-05-01 MED ORDER — CEPHALEXIN 500 MG PO CAPS: 500.0000 mg | ORAL_CAPSULE | Freq: Two times a day (BID) | ORAL | 0 refills | Status: DC

## 2024-05-01 NOTE — ED Triage Notes (Signed)
 Pt c/o urinary urgency with tingling since Saturday. States has a new partner and wants STI and pregnancy testing. States took AZO with some relief.

## 2024-05-01 NOTE — ED Provider Notes (Signed)
 [facility]    ASSESSMENT & PLAN:  1. Dysuria   2. Cystitis   3. Screening for STDs (sexually transmitted diseases)    Begin: Meds ordered this encounter  Medications   cephALEXin (KEFLEX) 500 MG capsule    Sig: Take 1 capsule (500 mg total) by mouth 2 (two) times daily.    Dispense:  10 capsule    Refill:  0   Vaginal cytology and HIV/RPR pending.  No signs of pyelonephritis.    Discharge Instructions      In addition to a urine culture, we have sent testing for sexually transmitted infections. We will notify you of any positive results once they are received. If required, we will prescribe any medications you might need.  Please refrain from all sexual activity for at least the next seven days.     Outlined signs and symptoms indicating need for more acute intervention. Patient verbalized understanding. After Visit Summary given.  SUBJECTIVE:  Caroline Weber is a 38 y.o. female who complains of urinary frequency, urgency and dysuria for the past 2-3 d. Without associated flank pain, fever, chills, vaginal discharge or bleeding. Gross hematuria: not present. No specific aggravating or alleviating factors reported. No LE edema. Normal PO intake without n/v/d. Without specific abdominal pain. Ambulatory without difficulty. Also requests STD testing. New partner. LMP: Patient's last menstrual period was 05/01/2024.  OBJECTIVE:  Vitals:   05/01/24 0847  BP: 119/78  Pulse: 80  Resp: 18  Temp: 98 F (36.7 C)  TempSrc: Oral  SpO2: 99%   General appearance: alert; no distress HENT: oropharynx: moist Lungs: unlabored respirations Abdomen: soft, non-tender Back: no CVA tenderness Extremities: no edema; symmetrical with no gross deformities Skin: warm and dry Neurologic: normal gait Psychological: alert and cooperative; normal mood and affect  Labs Reviewed  POCT URINALYSIS DIP (MANUAL ENTRY) - Abnormal; Notable for the following components:       Result Value   Color, UA straw (*)    Clarity, UA cloudy (*)    Blood, UA large (*)    Protein Ur, POC =30 (*)    Nitrite, UA Positive (*)    Leukocytes, UA Small (1+) (*)    All other components within normal limits  URINE CULTURE  HIV ANTIBODY (ROUTINE TESTING W REFLEX)  RPR  POCT URINE PREGNANCY  CERVICOVAGINAL ANCILLARY ONLY    No Known Allergies  Past Medical History:  Diagnosis Date   Medical history non-contributory    Miscarriage 01/2018   Social History   Socioeconomic History   Marital status: Single    Spouse name: Not on file   Number of children: Not on file   Years of education: Not on file   Highest education level: Not on file  Occupational History   Not on file  Tobacco Use   Smoking status: Never   Smokeless tobacco: Never  Vaping Use   Vaping status: Never Used  Substance and Sexual Activity   Alcohol use: Yes   Drug use: No   Sexual activity: Yes    Partners: Male    Birth control/protection: None  Other Topics Concern   Not on file  Social History Narrative   Not on file   Social Drivers of Health   Financial Resource Strain: Not on file  Food Insecurity: Not on file  Transportation Needs: Not on file  Physical Activity: Not on file  Stress: Not on file  Social Connections: Not on file  Intimate Partner Violence: Not  on file   Family History  Problem Relation Age of Onset   Healthy Mother    Hypertension Father         Afton Albright, MD 05/01/24 [phone]

## 2024-05-01 NOTE — Discharge Instructions (Signed)
In addition to a urine culture, we have sent testing for sexually transmitted infections. We will notify you of any positive results once they are received. If required, we will prescribe any medications you might need.  Please refrain from all sexual activity for at least the next seven days.  

## 2024-05-02 LAB — CERVICOVAGINAL ANCILLARY ONLY
Chlamydia: NEGATIVE
Comment: NEGATIVE
Comment: NEGATIVE
Comment: NORMAL
Neisseria Gonorrhea: NEGATIVE
Trichomonas: NEGATIVE

## 2024-05-02 LAB — RPR: RPR Ser Ql: NONREACTIVE

## 2024-05-03 ENCOUNTER — Ambulatory Visit (HOSPITAL_COMMUNITY): Payer: Self-pay

## 2024-05-03 LAB — URINE CULTURE: Culture: >=100000 — AB

## 2024-05-07 ENCOUNTER — Encounter (HOSPITAL_COMMUNITY): Payer: Self-pay | Admitting: *Deleted

## 2024-05-07 ENCOUNTER — Ambulatory Visit (HOSPITAL_COMMUNITY)
Admission: EM | Admit: 2024-05-07 | Discharge: 2024-05-07 | Disposition: A | Discharge status: Home / Self Care | Attending: Physician Assistant | Admitting: Physician Assistant

## 2024-05-07 ENCOUNTER — Other Ambulatory Visit: Payer: Self-pay

## 2024-05-07 DIAGNOSIS — N898 Other specified noninflammatory disorders of vagina: Secondary | ICD-10-CM | POA: Diagnosis not present

## 2024-05-07 MED ORDER — FLUCONAZOLE 150 MG PO TABS: 150.0000 mg | ORAL_TABLET | Freq: Once | ORAL | 0 refills | Status: AC

## 2024-05-07 NOTE — ED Provider Notes (Signed)
 [facility]    CSN: [id number] Arrival date & time: 05/07/24  1857      History   Chief Complaint Chief Complaint  Patient presents with   SEXUALLY TRANSMITTED DISEASE    HPI Caroline Weber is a 38 y.o. female.   Patient presents today requesting repeat STI testing.  She was seen by our clinic on 05/01/2024 at which point she was diagnosed with UTI and treated with cephalexin .  She had an STI test at that time that was negative but did not test for yeast or bacterial vaginosis and she was not experiencing any vaginal discharge.  Over the past 24 hours she has developed copious white vaginal discharge without odor.  She suspects this is related to yeast infection but wanted to have the testing redone to make sure that nothing else has developed because that she is experiencing discharge.  She reports that her urine symptoms have resolved and she is currently feeling well with no dysuria, urinary frequency, urinary urgency.    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 Authorizing Provider  acetaminophen  (TYLENOL ) 500 MG tablet Take 1 tablet (500 mg total) by mouth every 6 (six) hours as needed. 04/26/23   Redwine, Madison A, PA-C  fluconazole  (DIFLUCAN ) 150 MG tablet Take 1 tablet (150 mg total) by mouth once for 1 dose. Take second tablet 72 hours later if no improvement in  symptoms. 05/07/24 05/07/24  Ann Keto, MD  naproxen  (NAPROSYN ) 500 MG tablet Take 1 tablet (500 mg total) by mouth 2 (two) times daily. 01/20/24   Karon Packer, NP    Family History Family History  Problem Relation Age of Onset   Healthy Mother    Hypertension Father     Social History Social History   Tobacco Use   Smoking status: Never   Smokeless tobacco: Never  Vaping Use   Vaping status: Never Used  Substance Use Topics   Alcohol use: Yes   Drug use: No     Allergies   Patient has no known allergies.   Review of Systems Review of Systems  Constitutional:  Positive for activity change. Negative for appetite change, fatigue and fever.  Gastrointestinal:  Negative for abdominal pain, diarrhea, nausea and vomiting.  Genitourinary:  Positive for vaginal discharge and vaginal pain (irritation). Negative for dysuria, frequency, urgency and vaginal bleeding.     Physical Exam Triage Vital Signs ED Triage Vitals  Encounter Vitals Group     BP 05/07/24 1951 130/67     Systolic BP Percentile --  Diastolic BP Percentile --      Pulse Rate 05/07/24 1951 67     Resp 05/07/24 1951 18     Temp 05/07/24 1951 98.2 F (36.8 C)     Temp src --      SpO2 05/07/24 1951 96 %     Weight --      Height --      Head Circumference --      Peak Flow --      Pain Score 05/07/24 1949 0     Pain Loc --      Pain Education --      Exclude from Growth Chart --    No data found.  Updated Vital Signs BP 130/67   Pulse 67   Temp 98.2 F (36.8 C)   Resp 18   LMP 05/01/2024   SpO2 96%   Visual Acuity Right Eye Distance:   Left Eye Distance:   Bilateral Distance:    Right Eye Near:   Left Eye Near:    Bilateral Near:     Physical Exam Vitals reviewed.  Constitutional:      General: She is awake. She is not in acute distress.    Appearance: Normal appearance. She is well-developed. She is not ill-appearing.     Comments: Very pleasant female appears  stated age in no acute distress  HENT:     Head: Normocephalic and atraumatic.     Right Ear: Tympanic membrane, ear canal and external ear normal. Tympanic membrane is not erythematous or bulging.     Left Ear: Tympanic membrane, ear canal and external ear normal. Tympanic membrane is not erythematous or bulging.     Nose:     Right Sinus: Maxillary sinus tenderness present. No frontal sinus tenderness.     Left Sinus: Maxillary sinus tenderness and frontal sinus tenderness present.     Mouth/Throat:     Pharynx: Uvula midline. No oropharyngeal exudate or posterior oropharyngeal erythema.  Cardiovascular:     Rate and Rhythm: Normal rate and regular rhythm.     Heart sounds: Normal heart sounds, S1 normal and S2 normal. No murmur heard. Pulmonary:     Effort: Pulmonary effort is normal.     Breath sounds: Normal breath sounds. No wheezing, rhonchi or rales.     Comments: Clear to auscultation bilaterally Lymphadenopathy:     Head:     Right side of head: No submental, submandibular or tonsillar adenopathy.     Left side of head: No submental, submandibular or tonsillar adenopathy.     Cervical: No cervical adenopathy.  Psychiatric:        Behavior: Behavior is cooperative.      UC Treatments / Results  Labs (all labs ordered are listed, but only abnormal results are displayed) Labs Reviewed  CERVICOVAGINAL ANCILLARY ONLY    EKG   Radiology No results found.  Procedures Procedures (including critical care time)  Medications Ordered in UC Medications - No data to display  Initial Impression / Assessment and Plan / UC Course  I have reviewed the triage vital signs and the nursing notes.  Pertinent labs & imaging results that were available during my care of the patient were reviewed by me and considered in my medical decision making (see chart for details).     Patient is well-appearing, afebrile, nontoxic, nontachycardic.  Vital signs and physical exam are  reassuring.  UA was not repeated as she reports her symptoms have resolved.  She requested  a repeat STI testing now that she is symptomatic and so this was obtained per her request.  Discussed that her symptoms are most likely related to yeast vaginitis from antibiotic use and she was encouraged to pick up the Diflucan  that have been sent to her pharmacy.  She can use loosefitting cotton underwear and use hypoallergenic soaps and detergents for additional symptom relief.  We will contact her if we need to arrange additional treatment based on her swab result.  We discussed that if anything worsens or changes and she has abdominal pain, pelvic pain, fever, nausea, vomiting, change in character of discharge she should be seen immediately.  Strict return precautions given.  Final Clinical Impressions(s) / UC Diagnoses   Final diagnoses:  Vaginal discharge  Vaginal irritation     Discharge Instructions      As we discussed, I believe your symptoms are related to a yeast infection caused by the antibiotic.  Pick up the Diflucan  that was sent into the pharmacy.  We will contact you if we need to arrange any additional treatment.  Wear loosefitting cotton underwear and use hypoallergenic soaps and detergents.  If you develop any pelvic pain, abdominal pain, fever, nausea, vomiting you should be seen immediately.  ED Prescriptions   None    PDMP not reviewed this encounter.   Budd Cargo, PA-C 05/07/24 2036

## 2024-05-07 NOTE — Telephone Encounter (Signed)
 Pt requests medication for abx-associated yeast infection. Diflucan  sent per protocol.

## 2024-05-07 NOTE — Discharge Instructions (Addendum)
 As we discussed, I believe your symptoms are related to a yeast infection caused by the antibiotic.  Pick up the Diflucan  that was sent into the pharmacy.  We will contact you if we need to arrange any additional treatment.  Wear loosefitting cotton underwear and use hypoallergenic soaps and detergents.  If you develop any pelvic pain, abdominal pain, fever, nausea, vomiting you should be seen immediately.

## 2024-05-07 NOTE — ED Triage Notes (Signed)
 PT wants to be rechecked for STD the patient last seen on 05-01-24

## 2024-05-08 LAB — CERVICOVAGINAL ANCILLARY ONLY
Bacterial Vaginitis (gardnerella): POSITIVE — AB
Candida Glabrata: NEGATIVE
Candida Vaginitis: POSITIVE — AB
Chlamydia: NEGATIVE
Comment: NEGATIVE
Comment: NEGATIVE
Comment: NEGATIVE
Comment: NEGATIVE
Comment: NEGATIVE
Comment: NORMAL
Neisseria Gonorrhea: NEGATIVE
Trichomonas: NEGATIVE

## 2024-05-09 ENCOUNTER — Ambulatory Visit (HOSPITAL_COMMUNITY): Payer: Self-pay

## 2024-05-09 MED ORDER — METRONIDAZOLE 500 MG PO TABS: 500.0000 mg | ORAL_TABLET | Freq: Two times a day (BID) | ORAL | 0 refills | Status: DC

## 2024-06-26 ENCOUNTER — Ambulatory Visit (HOSPITAL_COMMUNITY): Admission: EM | Admit: 2024-06-26 | Discharge: 2024-06-26 | Disposition: A | Discharge status: Home / Self Care

## 2024-06-26 ENCOUNTER — Encounter (HOSPITAL_COMMUNITY): Payer: Self-pay

## 2024-06-26 DIAGNOSIS — K644 Residual hemorrhoidal skin tags: Secondary | ICD-10-CM

## 2024-06-26 NOTE — Discharge Instructions (Signed)
 Your external hemorrhoid does not look like it needs to be lanced. Keep on the current treatment you are doing and keep your stools soft as you have been. If the pain gets worse and the area larger, come back.

## 2024-06-26 NOTE — ED Provider Notes (Signed)
 [facility]    CSN: [id number] Arrival date & time: 06/26/24  9189      History   Chief Complaint Chief Complaint  Patient presents with   Hemorrhoids    HPI Caroline Weber is a 38 y.o. female who presents with external hemorrhoid x 2 days. She states she is feeling better since using preparation H cream and which hazel topically, and her stools have been soft. Has past hx of hemorrhoids since age 84 years old and had to have it lanced. That is why she is here. She has not had any bleeding with this one.     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    Past Surgical History:  Procedure Laterality Date   NO PAST SURGERIES      OB History     Gravida  6   Para      Term      Preterm      AB  5   Living  0      SAB  4   IAB  1   Ectopic      Multiple      Live Births  0        Obstetric Comments  1) blighted ovum 2) first trimester SAB of twins          Home Medications    Prior to Admission medications   Not on File    Family History Family History  Problem Relation Age of Onset   Healthy Mother    Hypertension Father     Social History Social History   Tobacco Use   Smoking status: Never   Smokeless tobacco: Never  Vaping Use   Vaping status: Never Used  Substance Use Topics   Alcohol use: Yes   Drug use: No     Allergies   Patient has no known allergies.   Review of Systems Review of Systems  As noted in HPI  Physical Exam Triage Vital Signs ED Triage Vitals  Encounter Vitals Group     BP 06/26/24 0832 128/89     Girls Systolic BP Percentile --      Girls Diastolic BP Percentile --      Boys Systolic BP Percentile --      Boys Diastolic BP Percentile --      Pulse Rate 06/26/24 0832 78     Resp 06/26/24 0832 16     Temp 06/26/24  0832 98.3 F (36.8 C)     Temp Source 06/26/24 0832 Oral     SpO2 06/26/24 0832 97 %     Weight --      Height --      Head Circumference --      Peak Flow --      Pain Score 06/26/24 0835 5     Pain Loc --      Pain Education --      Exclude from Growth Chart --    No data found.  Updated Vital Signs BP 128/89 (BP Location: Left Arm)   Pulse 78   Temp 98.3 F (36.8 C) (Oral)   Resp 16   LMP 06/23/2024 (Exact Date)   SpO2 97%   Visual Acuity Right Eye Distance:   Left Eye Distance:   Bilateral Distance:  Right Eye Near:   Left Eye Near:    Bilateral Near:     Physical Exam Vitals and nursing note reviewed.  Constitutional:      General: She is not in acute distress.    Appearance: She is obese.  HENT:     Right Ear: External ear normal.     Left Ear: External ear normal.  Eyes:     General: No scleral icterus.    Conjunctiva/sclera: Conjunctivae normal.  Pulmonary:     Effort: Pulmonary effort is normal.  Genitourinary:    Comments: Has large external hemorrhoid which is not thrombosed and is mildly tender to palpation. Digital exam was not done.  Musculoskeletal:        General: Normal range of motion.     Cervical back: Neck supple.  Skin:    General: Skin is warm and dry.  Neurological:     Mental Status: She is alert and oriented to person, place, and time.     Gait: Gait normal.  Psychiatric:        Mood and Affect: Mood normal.        Behavior: Behavior normal.        Thought Content: Thought content normal.        Judgment: Judgment normal.      UC Treatments / Results  Labs (all labs ordered are listed, but only abnormal results are displayed) Labs Reviewed - No data to display  EKG   Radiology No results found.  Procedures Procedures (including critical care time)  Medications Ordered in UC Medications - No data to display  Initial Impression / Assessment and Plan / UC Course  I have reviewed the triage vital signs and the  nursing notes.  External non thrombosed hemorrhoid  She was advised to continue current treatment since she is improving.  See instructions.   Final Clinical Impressions(s) / UC Diagnoses   Final diagnoses:  External hemorrhoid     Discharge Instructions      Your external hemorrhoid does not look like it needs to be lanced. Keep on the current treatment you are doing and keep your stools soft as you have been. If the pain gets worse and the area larger, come back.      ED Prescriptions   None    PDMP not reviewed this encounter.   Lindi Carter, [place] 06/26/24 [phone]

## 2024-06-26 NOTE — ED Triage Notes (Signed)
 Patient here today with c/o hemorrhoids that came out on Sunday. Patient states that her pain has improved but she would like to get it checked.

## 2024-07-07 ENCOUNTER — Telehealth: Admitting: Family Medicine

## 2024-07-07 DIAGNOSIS — N760 Acute vaginitis: Secondary | ICD-10-CM

## 2024-07-07 MED ORDER — METRONIDAZOLE 500 MG PO TABS: 500.0000 mg | ORAL_TABLET | Freq: Two times a day (BID) | ORAL | 0 refills | Status: AC

## 2024-07-07 NOTE — Progress Notes (Signed)
E-Visit for Vaginal Symptoms  We are sorry that you are not feeling well. Here is how we plan to help! Based on what you shared with me it looks like you: May have a vaginosis due to bacteria  Vaginosis is an inflammation of the vagina that can result in discharge, itching and pain. The cause is usually a change in the normal balance of vaginal bacteria or an infection. Vaginosis can also result from reduced estrogen levels after menopause.  The most common causes of vaginosis are:   Bacterial vaginosis which results from an overgrowth of one on several organisms that are normally present in your vagina.   Yeast infections which are caused by a naturally occurring fungus called candida.   Vaginal atrophy (atrophic vaginosis) which results from the thinning of the vagina from reduced estrogen levels after menopause.   Trichomoniasis which is caused by a parasite and is commonly transmitted by sexual intercourse.  Factors that increase your risk of developing vaginosis include: Medications, such as antibiotics and steroids Uncontrolled diabetes Use of hygiene products such as bubble bath, vaginal spray or vaginal deodorant Douching Wearing damp or tight-fitting clothing Using an intrauterine device (IUD) for birth control Hormonal changes, such as those associated with pregnancy, birth control pills or menopause Sexual activity Having a sexually transmitted infection  Your treatment plan is Metronidazole or Flagyl 500mg twice a day for 7 days.  I have electronically sent this prescription into the pharmacy that you have chosen.  Be sure to take all of the medication as directed. Stop taking any medication if you develop a rash, tongue swelling or shortness of breath. Mothers who are breast feeding should consider pumping and discarding their breast milk while on these antibiotics. However, there is no consensus that infant exposure at these doses would be harmful.  Remember that  medication creams can weaken latex condoms. .   HOME CARE:  Good hygiene may prevent some types of vaginosis from recurring and may relieve some symptoms:  Avoid baths, hot tubs and whirlpool spas. Rinse soap from your outer genital area after a shower, and dry the area well to prevent irritation. Don't use scented or harsh soaps, such as those with deodorant or antibacterial action. Avoid irritants. These include scented tampons and pads. Wipe from front to back after using the toilet. Doing so avoids spreading fecal bacteria to your vagina.  Other things that may help prevent vaginosis include:  Don't douche. Your vagina doesn't require cleansing other than normal bathing. Repetitive douching disrupts the normal organisms that reside in the vagina and can actually increase your risk of vaginal infection. Douching won't clear up a vaginal infection. Use a latex condom. Both female and female latex condoms may help you avoid infections spread by sexual contact. Wear cotton underwear. Also wear pantyhose with a cotton crotch. If you feel comfortable without it, skip wearing underwear to bed. Yeast thrives in moist environments Your symptoms should improve in the next day or two.  GET HELP RIGHT AWAY IF:  You have pain in your lower abdomen ( pelvic area or over your ovaries) You develop nausea or vomiting You develop a fever Your discharge changes or worsens You have persistent pain with intercourse You develop shortness of breath, a rapid pulse, or you faint.  These symptoms could be signs of problems or infections that need to be evaluated by a medical provider now.  MAKE SURE YOU   Understand these instructions. Will watch your condition. Will get help right   away if you are not doing well or get worse.  Thank you for choosing an e-visit.  Your e-visit answers were reviewed by a board certified advanced clinical practitioner to complete your personal care plan. Depending upon the  condition, your plan could have included both over the counter or prescription medications.  Please review your pharmacy choice. Make sure the pharmacy is open so you can pick up prescription now. If there is a problem, you may contact your provider through MyChart messaging and have the prescription routed to another pharmacy.  Your safety is important to us. If you have drug allergies check your prescription carefully.   For the next 24 hours you can use MyChart to ask questions about today's visit, request a non-urgent call back, or ask for a work or school excuse. You will get an email in the next two days asking about your experience. I hope that your e-visit has been valuable and will speed your recovery. I have spent 5 minutes in review of e-visit questionnaire, review and updating patient chart, medical decision making and response to patient.   Ocean Schildt, PA-C    

## 2024-07-17 ENCOUNTER — Encounter (HOSPITAL_COMMUNITY): Payer: Self-pay | Admitting: *Deleted

## 2024-07-17 ENCOUNTER — Ambulatory Visit (HOSPITAL_COMMUNITY)
Admission: EM | Admit: 2024-07-17 | Discharge: 2024-07-17 | Disposition: A | Discharge status: Home / Self Care | Attending: Emergency Medicine | Admitting: Emergency Medicine

## 2024-07-17 DIAGNOSIS — R350 Frequency of micturition: Secondary | ICD-10-CM | POA: Diagnosis not present

## 2024-07-17 DIAGNOSIS — Z3202 Encounter for pregnancy test, result negative: Secondary | ICD-10-CM

## 2024-07-17 DIAGNOSIS — N898 Other specified noninflammatory disorders of vagina: Secondary | ICD-10-CM

## 2024-07-17 DIAGNOSIS — N390 Urinary tract infection, site not specified: Secondary | ICD-10-CM | POA: Diagnosis present

## 2024-07-17 LAB — POCT URINALYSIS DIP (MANUAL ENTRY)
Bilirubin, UA: NEGATIVE
Glucose, UA: NEGATIVE mg/dL
Ketones, POC UA: NEGATIVE mg/dL
Nitrite, UA: NEGATIVE
Protein Ur, POC: NEGATIVE mg/dL
Spec Grav, UA: 1.02 (ref 1.010–1.025)
Urobilinogen, UA: 0.2 U/dL
pH, UA: 6 (ref 5.0–8.0)

## 2024-07-17 LAB — POCT URINE PREGNANCY: Preg Test, Ur: NEGATIVE

## 2024-07-17 MED ORDER — CLINDAMYCIN HCL 300 MG PO CAPS: 300.0000 mg | ORAL_CAPSULE | Freq: Three times a day (TID) | ORAL | 0 refills | Status: DC

## 2024-07-17 MED ORDER — CEPHALEXIN 500 MG PO CAPS: 500.0000 mg | ORAL_CAPSULE | Freq: Four times a day (QID) | ORAL | 0 refills | Status: DC

## 2024-07-17 NOTE — ED Provider Notes (Signed)
 [facility]    CSN: [id number] Arrival date & time: 07/17/24  0805      History   Chief Complaint Chief Complaint  Patient presents with   SEXUALLY TRANSMITTED DISEASE   Back Pain    HPI Caroline Weber is a 38 y.o. female.   Patient presents today with urinary frequency white thick discharge.  Recently was treated for BV and yeast infection.  She has 1 more tablet of Diflucan  to take today.  Patient states that her partner is on medication which can cause frequent BV and thinks this is the cause.  Patient would like to have STD testing also.  Denies any abdominal pain no nausea vomiting or diarrhea.  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 Authorizing Provider  cephALEXin  (KEFLEX ) 500 MG capsule Take 1 capsule (500 mg total) by mouth 4 (four) times daily. 07/17/24  Yes Merilee Andrea CROME, NP  clindamycin  (CLEOCIN ) 300 MG capsule Take 1 capsule (300 mg total) by mouth 3 (three) times daily. 07/17/24  Yes Merilee Andrea CROME, NP    Family History Family History  Problem Relation Age of Onset   Healthy Mother    Hypertension Father     Social History Social History   Tobacco Use   Smoking status: Never   Smokeless tobacco: Never  Vaping Use   Vaping status: Never Used  Substance Use Topics    Alcohol use: Yes   Drug use: No     Allergies   Patient has no known allergies.   Review of Systems Review of Systems  Constitutional:  Negative for fever.  Respiratory: Negative.    Cardiovascular: Negative.   Gastrointestinal:  Negative for abdominal pain, diarrhea, nausea and vomiting.  Genitourinary:  Positive for frequency, hematuria, urgency and vaginal discharge. Negative for pelvic pain and vaginal pain.  Neurological: Negative.      Physical Exam Triage Vital Signs ED Triage Vitals  Encounter Vitals Group     BP 07/17/24 0839 (!) 128/93     Girls Systolic BP Percentile --      Girls Diastolic BP Percentile --      Boys Systolic BP Percentile --      Boys Diastolic BP Percentile --      Pulse Rate 07/17/24 0839 66     Resp 07/17/24 0839 18     Temp 07/17/24 0839 97.7 F (36.5 C)     Temp Source 07/17/24 0839 Oral     SpO2  07/17/24 0839 98 %     Weight --      Height --      Head Circumference --      Peak Flow --      Pain Score 07/17/24 0837 0     Pain Loc --      Pain Education --      Exclude from Growth Chart --    No data found.  Updated Vital Signs BP (!) 128/93 (BP Location: Left Arm)   Pulse 66   Temp 97.7 F (36.5 C) (Oral)   Resp 18   LMP 06/23/2024 (Exact Date)   SpO2 98%   Visual Acuity Right Eye Distance:   Left Eye Distance:   Bilateral Distance:    Right Eye Near:   Left Eye Near:    Bilateral Near:     Physical Exam Constitutional:      Appearance: Normal appearance.  Cardiovascular:     Rate and Rhythm: Normal rate.     Pulses: Normal pulses.     Heart sounds: Normal heart sounds.  Pulmonary:     Effort: Pulmonary effort is normal.  Abdominal:     General: Abdomen is flat. Bowel sounds are normal.     Tenderness: There is no abdominal tenderness. There is no right CVA tenderness or left CVA tenderness.  Neurological:     General: No focal deficit present.     Mental Status: She is alert.      UC Treatments /  Results  Labs (all labs ordered are listed, but only abnormal results are displayed) Labs Reviewed  POCT URINALYSIS DIP (MANUAL ENTRY) - Abnormal; Notable for the following components:      Result Value   Clarity, UA cloudy (*)    Blood, UA trace-lysed (*)    Leukocytes, UA Trace (*)    All other components within normal limits  URINE CULTURE  POCT URINE PREGNANCY  CERVICOVAGINAL ANCILLARY ONLY    EKG   Radiology No results found.  Procedures Procedures (including critical care time)  Medications Ordered in UC Medications - No data to display  Initial Impression / Assessment and Plan / UC Course  I have reviewed the triage vital signs and the nursing notes.  Pertinent labs & imaging results that were available during my care of the patient were reviewed by me and considered in my medical decision making (see chart for details).     Take full dose of antibiotics even if you start to feel better We will send off test for STDs check your MyChart in 3 days for results Drink plenty of fluids Discussed changing medication from previous treatment   Final Clinical Impressions(s) / UC Diagnoses   Final diagnoses:  Lower urinary tract infectious disease  Vaginal discharge  Urinary frequency     Discharge Instructions      Take full dose of antibiotics even if you start to feel better We will send off test for STDs check your MyChart in 3 days for results Drink plenty of fluids     ED Prescriptions     Medication Sig Dispense Auth. Provider   cephALEXin  (KEFLEX ) 500 MG capsule Take 1 capsule (500 mg total) by mouth 4 (four) times daily. 20 capsule Merilee Hollering L, NP   clindamycin  (CLEOCIN ) 300 MG capsule Take 1 capsule (300 mg total) by mouth 3 (three) times daily. 21 capsule Merilee Hollering CROME, NP      PDMP not reviewed this encounter.   Merilee,  Andrea CROME, NP 07/17/24 [phone]

## 2024-07-17 NOTE — ED Triage Notes (Addendum)
 Pt states that she would like checked for UTI and STI. She is having low back pain and spotting and it is not time for her cycle, she states that she is having some vaginal discharge as well. She would like cyto and blood work   She took a fluconazole  last night for a possible yeast infection, and states shew as treated for BV last week.   She said her partner is on a prostate cancer drug that can cause UTI so she thinks she might have one of those.

## 2024-07-17 NOTE — Discharge Instructions (Signed)
 Take full dose of antibiotics even if you start to feel better We will send off test for STDs check your MyChart in 3 days for results Drink plenty of fluids

## 2024-07-18 ENCOUNTER — Ambulatory Visit (HOSPITAL_COMMUNITY): Payer: Self-pay

## 2024-07-18 LAB — CERVICOVAGINAL ANCILLARY ONLY
Bacterial Vaginitis (gardnerella): NEGATIVE
Candida Glabrata: NEGATIVE
Candida Vaginitis: POSITIVE — AB
Chlamydia: NEGATIVE
Comment: NEGATIVE
Comment: NEGATIVE
Comment: NEGATIVE
Comment: NEGATIVE
Comment: NEGATIVE
Comment: NORMAL
Neisseria Gonorrhea: NEGATIVE
Trichomonas: NEGATIVE

## 2024-07-18 LAB — URINE CULTURE: Culture: NO GROWTH

## 2024-07-18 MED ORDER — FLUCONAZOLE 150 MG PO TABS: 150.0000 mg | ORAL_TABLET | Freq: Once | ORAL | 0 refills | Status: AC

## 2024-09-12 ENCOUNTER — Telehealth: Admitting: Physician Assistant

## 2024-09-12 DIAGNOSIS — B3731 Acute candidiasis of vulva and vagina: Secondary | ICD-10-CM

## 2024-09-12 MED ORDER — FLUCONAZOLE 150 MG PO TABS: ORAL_TABLET | ORAL | 0 refills | Status: AC

## 2024-09-12 NOTE — Progress Notes (Signed)

## 2024-09-15 ENCOUNTER — Telehealth: Admitting: Family Medicine

## 2024-09-15 DIAGNOSIS — B3731 Acute candidiasis of vulva and vagina: Secondary | ICD-10-CM

## 2024-09-15 DIAGNOSIS — N760 Acute vaginitis: Secondary | ICD-10-CM

## 2024-09-15 DIAGNOSIS — N3000 Acute cystitis without hematuria: Secondary | ICD-10-CM | POA: Diagnosis not present

## 2024-09-15 DIAGNOSIS — B9689 Other specified bacterial agents as the cause of diseases classified elsewhere: Secondary | ICD-10-CM

## 2024-09-15 MED ORDER — CEPHALEXIN 500 MG PO CAPS: 500.0000 mg | ORAL_CAPSULE | Freq: Two times a day (BID) | ORAL | 0 refills | Status: AC

## 2024-09-15 MED ORDER — FLUCONAZOLE 150 MG PO TABS: 150.0000 mg | ORAL_TABLET | Freq: Every day | ORAL | 0 refills | Status: AC

## 2024-09-15 MED ORDER — METRONIDAZOLE 500 MG PO TABS: 500.0000 mg | ORAL_TABLET | Freq: Two times a day (BID) | ORAL | 0 refills | Status: AC

## 2024-09-15 NOTE — Progress Notes (Signed)
 Virtual Visit Consent   Caroline Weber, you are scheduled for a virtual visit with a [facility] provider today. Just as with appointments in the office, your consent must be obtained to participate. Your consent will be active for this visit and any virtual visit you may have with one of our providers in the next 365 days. If you have a MyChart account, a copy of this consent can be sent to you electronically.  As this is a virtual visit, video technology does not allow for your provider to perform a traditional examination. This may limit your provider's ability to fully assess your condition. If your provider identifies any concerns that need to be evaluated in person or the need to arrange testing (such as labs, EKG, etc.), we will make arrangements to do so. Although advances in technology are sophisticated, we cannot ensure that it will always work on either your end or our end. If the connection with a video visit is poor, the visit may have to be switched to a telephone visit. With either a video or telephone visit, we are not always able to ensure that we have a secure connection.  By engaging in this virtual visit, you consent to the provision of healthcare and authorize for your insurance to be billed (if applicable) for the services provided during this visit. Depending on your insurance coverage, you may receive a charge related to this service.  I need to obtain your verbal consent now. Are you willing to proceed with your visit today? Caroline Weber has provided verbal consent on 09/15/2024 for a virtual visit (video or telephone). Caroline Lamp, FNP  Date: 09/15/2024 11:00 AM   Virtual Visit via Video Note   I, Caroline Weber, connected with  Caroline Weber  ([id number], 36-Jun-1987) on 09/15/24 at 11:00 AM EDT by a video-enabled telemedicine application and verified that I am speaking with the correct person using two identifiers.  Location: Patient: Virtual Visit Location Patient:  Home Provider: Virtual Visit Location Provider: Home Office   I discussed the limitations of evaluation and management by telemedicine and the availability of in person appointments. The patient expressed understanding and agreed to proceed.    History of Present Illness: Caroline Weber is a 38 y.o. who identifies as a female who was assigned female at birth, and is being seen today for vag discharge with fishy odor, burning and frequency with urination, and she says she will need a diflucan  due to ATB No fever or severe abd pain. 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 Repeat in 3 days if needed., Disp: 2 tablet, Rfl: 0  Observations/Objective: Patient is well-developed, well-nourished in no acute distress.  Resting comfortably  at home.  Head is normocephalic, atraumatic.  No labored breathing.  Speech is clear and  coherent with logical content.  Patient is alert and oriented at baseline.    Assessment and Plan: 1. Acute cystitis without hematuria (Primary)  2. Bacterial vaginosis  3. Candidiasis of vagina  4. Yeast vaginitis  Increase fluids, UC as needed. Prevention discussed.   Follow Up Instructions: I discussed the assessment and treatment plan with the patient. The patient was provided an opportunity to ask questions and all were answered. The patient agreed  with the plan and demonstrated an understanding of the instructions.  A copy of instructions were sent to the patient via MyChart unless otherwise noted below.     The patient was advised to call back or seek an in-person evaluation if the symptoms worsen or if the condition fails to improve as anticipated.    Keison Glendinning, FNP

## 2024-09-15 NOTE — Progress Notes (Signed)
   Thank you for the details you included in the comment boxes. Those details are very helpful in determining the best course of treatment for you and help us  to provide the best care.Because Ms. Caroline Weber, we recommend that you schedule a Virtual Urgent Care video visit in order for the provider to better assess what is going on.  The provider will be able to give you a more accurate diagnosis and treatment plan if we can more freely discuss your symptoms and with the addition of a virtual examination.   If you change your visit to a video visit, we will bill your insurance (similar to an office visit) and you will not be charged for this e-Visit. You will be able to stay at home and speak with the first available [facility] advanced practice provider. The link to do a video visit is in the drop down Menu tab of your Welcome screen in MyChart.

## 2024-09-15 NOTE — Patient Instructions (Signed)
